# Patient Record
Sex: Male | Born: 1945
Health system: Southern US, Community
[De-identification: ages and names within clinical notes are randomized; demographics above are authoritative.]

## PROBLEM LIST (undated history)

## (undated) HISTORY — PX: APPENDECTOMY: SHX54

## (undated) HISTORY — PX: HAND SURGERY: SHX662

---

## 2003-11-29 ENCOUNTER — Ambulatory Visit: Payer: Self-pay | Admitting: Nurse Practitioner

## 2003-12-11 ENCOUNTER — Ambulatory Visit: Payer: Self-pay | Admitting: Nurse Practitioner

## 2004-01-09 ENCOUNTER — Ambulatory Visit: Payer: Self-pay | Admitting: Nurse Practitioner

## 2004-01-29 ENCOUNTER — Ambulatory Visit: Payer: Self-pay | Admitting: Nurse Practitioner

## 2004-02-13 ENCOUNTER — Ambulatory Visit: Payer: Self-pay | Admitting: Nurse Practitioner

## 2004-04-16 ENCOUNTER — Ambulatory Visit: Payer: Self-pay | Admitting: Nurse Practitioner

## 2004-04-22 ENCOUNTER — Ambulatory Visit: Payer: Self-pay | Admitting: Internal Medicine

## 2004-04-23 ENCOUNTER — Ambulatory Visit (HOSPITAL_COMMUNITY): Admission: RE | Admit: 2004-04-23 | Discharge: 2004-04-23 | Payer: Self-pay | Admitting: Gastroenterology

## 2004-04-23 ENCOUNTER — Encounter (INDEPENDENT_AMBULATORY_CARE_PROVIDER_SITE_OTHER): Payer: Self-pay | Admitting: *Deleted

## 2004-05-26 ENCOUNTER — Ambulatory Visit: Payer: Self-pay | Admitting: Nurse Practitioner

## 2004-08-04 ENCOUNTER — Ambulatory Visit: Payer: Self-pay | Admitting: Nurse Practitioner

## 2004-12-30 ENCOUNTER — Ambulatory Visit: Payer: Self-pay | Admitting: Nurse Practitioner

## 2007-05-23 ENCOUNTER — Ambulatory Visit: Payer: Self-pay | Admitting: Family Medicine

## 2008-03-07 ENCOUNTER — Encounter (INDEPENDENT_AMBULATORY_CARE_PROVIDER_SITE_OTHER): Payer: Self-pay | Admitting: Internal Medicine

## 2008-03-07 ENCOUNTER — Ambulatory Visit: Payer: Self-pay | Admitting: Internal Medicine

## 2008-03-07 LAB — CONVERTED CEMR LAB
AST: 18 units/L (ref 0–37)
Albumin: 4.4 g/dL (ref 3.5–5.2)
Alkaline Phosphatase: 62 units/L (ref 39–117)
Potassium: 4.3 meq/L (ref 3.5–5.3)
Sodium: 141 meq/L (ref 135–145)
Total Protein: 7.6 g/dL (ref 6.0–8.3)

## 2008-04-09 ENCOUNTER — Ambulatory Visit: Payer: Self-pay | Admitting: Internal Medicine

## 2008-04-09 ENCOUNTER — Encounter (INDEPENDENT_AMBULATORY_CARE_PROVIDER_SITE_OTHER): Payer: Self-pay | Admitting: Internal Medicine

## 2008-04-09 LAB — CONVERTED CEMR LAB: PSA: 0.85 ng/mL (ref 0.10–4.00)

## 2008-05-15 ENCOUNTER — Encounter (INDEPENDENT_AMBULATORY_CARE_PROVIDER_SITE_OTHER): Payer: Self-pay | Admitting: Internal Medicine

## 2008-05-15 ENCOUNTER — Ambulatory Visit: Payer: Self-pay | Admitting: Internal Medicine

## 2008-05-15 LAB — CONVERTED CEMR LAB
Cholesterol: 192 mg/dL (ref 0–200)
HDL: 73 mg/dL (ref >39)
LDL Cholesterol: 99 mg/dL (ref 0–99)
Triglycerides: 98 mg/dL (ref <150)
VLDL: 20 mg/dL (ref 0–40)

## 2009-04-19 ENCOUNTER — Ambulatory Visit: Payer: Self-pay | Admitting: Internal Medicine

## 2010-06-06 NOTE — Op Note (Signed)
Garrett Lewis, Garrett Lewis                ACCOUNT NO.:  [id number]   MEDICAL RECORD NO.:  [id number]          PATIENT TYPE:  AMB   LOCATION:  ENDO                         FACILITY:  [facility]   PHYSICIAN:  Petra Kuba, M.D.    DATE OF BIRTH:  [birth year]   DATE OF PROCEDURE:  04/23/2004  DATE OF DISCHARGE:                                 OPERATIVE REPORT   PROCEDURES:  Colonoscopy with polypectomy.   INDICATIONS:  Bright red blood per rectum.  Guaiac positivity.  Due to  colonic screening.  The consent was signed after the risks, benefits,  methods and options were thoroughly discussed in the office.   MEDICINES USED:  1.  Demerol 60 mg.  2.  Versed 6 mg.   DESCRIPTION OF PROCEDURE:  Rectal inspection was pertinent for external  hemorrhoids, small.  Digital exam was negative.  The video pediatric  adjustable colonoscope was inserted and easily advanced around the colon to  the cecum.  Other than some left-sided occasional diverticula, no  abnormalities were seen.  On insertion, it did require some abdominal  pressure, but no position changes.  We did advance for a quick look into the  TI, which was normal on very quick evaluation.  The scope was slowly  withdrawn.  Prep was adequate.  There was some liquid stool that require  washing and suctioning.  On slow withdrawal through the colon, the cecum,  ascending, transverse and descending were normal.  As the scope was  withdrawn around the sigmoid, in the distal sigmoid a moderate sized  pedunculated polyp was seen, snared and electrocautery applied.  The polyp  was removed.  It did fragment into three pieces, two of which were suctioned  onto the head of the scope and removed.  One needed to be snared and  recovered by withdrawing the scope.  After all three pieces were removed and  recovered, the scope was reinserted.  The stalk had a nice white coagulum  without any residual polypoid tissue.  Possibly a few other fragments were  suctioned through the scope and collected in the trap and put in the same  container.  In the rectum was another moderate sized polyp, which was snared  and electrocautery applied.  The polyp was suctioned onto the head of the  scope after removal and this polyp was recovered and put in a second  container.  Possibly there was some residual polypoid and another snare  removed a few pieces of tissues.  A few hot biopsies of the base were  obtained as well and put in the second container.  No obvious residual  polypoidal tissue was seen at the junction.  Anorectal pull through and  retroflexion confirmed some small hemorrhoids.  The scope was straightened  and readvanced a short ways up the left side of the colon.  Air was  suctioned and the scope was removed.  The patient tolerated the procedure  well.  There was no obvious immediate complication.   ENDOSCOPIC DIAGNOSES:  1.  Internal and external hemorrhoids.  2.  Left occasional diverticula.  3.  Medium rectal polyps snared x 2 and hot biopsied at the base x 2.  4.  Moderate size sigmoid pedunculated polyp, status post snaring removed,      although it did break into the three pieces.  All were recovered.  5.  Otherwise within normal limits to the cecum and a quite look at the TI.   PLAN:  Await pathology to determine future screening.  Happy to see back  p.r.n.  Otherwise return care to the [facility] doctors  to repeat guaiacs, CBC, check symptoms and make sure no further workup needs  to be done like an upper GI or small bowel follow through.      MEM/MEDQ  D:  04/23/2004  T:  04/23/2004  Job:  [id number]   cc:   [facility]  [address]

## 2010-06-30 ENCOUNTER — Inpatient Hospital Stay (HOSPITAL_COMMUNITY)
Admission: EM | Admit: 2010-06-30 | Discharge: 2010-07-19 | DRG: 341 | Disposition: A | Discharge status: Home / Self Care | Payer: Medicare Other | Attending: General Surgery | Admitting: General Surgery

## 2010-06-30 ENCOUNTER — Emergency Department (HOSPITAL_COMMUNITY): Payer: Medicare Other

## 2010-06-30 DIAGNOSIS — F1010 Alcohol abuse, uncomplicated: Secondary | ICD-10-CM | POA: Diagnosis present

## 2010-06-30 DIAGNOSIS — A09 Infectious gastroenteritis and colitis, unspecified: Principal | ICD-10-CM | POA: Diagnosis present

## 2010-06-30 DIAGNOSIS — K3533 Acute appendicitis with perforation and localized peritonitis, with abscess: Secondary | ICD-10-CM | POA: Diagnosis not present

## 2010-06-30 DIAGNOSIS — R7309 Other abnormal glucose: Secondary | ICD-10-CM | POA: Diagnosis present

## 2010-06-30 DIAGNOSIS — Z860100 Personal history of colon polyps, unspecified: Secondary | ICD-10-CM

## 2010-06-30 DIAGNOSIS — J690 Pneumonitis due to inhalation of food and vomit: Secondary | ICD-10-CM | POA: Diagnosis not present

## 2010-06-30 DIAGNOSIS — R609 Edema, unspecified: Secondary | ICD-10-CM | POA: Diagnosis not present

## 2010-06-30 DIAGNOSIS — K560 Paralytic ileus: Secondary | ICD-10-CM | POA: Diagnosis not present

## 2010-06-30 DIAGNOSIS — K5900 Constipation, unspecified: Secondary | ICD-10-CM | POA: Diagnosis present

## 2010-06-30 DIAGNOSIS — K5730 Diverticulosis of large intestine without perforation or abscess without bleeding: Secondary | ICD-10-CM | POA: Diagnosis present

## 2010-06-30 DIAGNOSIS — N179 Acute kidney failure, unspecified: Secondary | ICD-10-CM | POA: Diagnosis not present

## 2010-06-30 DIAGNOSIS — R0902 Hypoxemia: Secondary | ICD-10-CM | POA: Diagnosis not present

## 2010-06-30 LAB — URINALYSIS, ROUTINE W REFLEX MICROSCOPIC
Nitrite: NEGATIVE
Specific Gravity, Urine: 1.036 — ABNORMAL HIGH (ref 1.005–1.030)
Urobilinogen, UA: 0.2 mg/dL (ref 0.0–1.0)

## 2010-06-30 LAB — CBC
MCHC: 33.7 g/dL (ref 30.0–36.0)
MCV: 82.3 fL (ref 78.0–100.0)
Platelets: 260 10*3/uL (ref 150–400)
RDW: 13.8 % (ref 11.5–15.5)
WBC: 13.6 10*3/uL — ABNORMAL HIGH (ref 4.0–10.5)

## 2010-06-30 LAB — COMPREHENSIVE METABOLIC PANEL
AST: 15 U/L (ref 0–37)
Albumin: 3.7 g/dL (ref 3.5–5.2)
BUN: 14 mg/dL (ref 6–23)
Calcium: 8.9 mg/dL (ref 8.4–10.5)
Chloride: 101 mEq/L (ref 96–112)
Creatinine, Ser: 1 mg/dL (ref 0.4–1.5)
Total Protein: 7 g/dL (ref 6.0–8.3)

## 2010-06-30 LAB — DIFFERENTIAL
Eosinophils Absolute: 0 10*3/uL (ref 0.0–0.7)
Eosinophils Relative: 0 % (ref 0–5)
Lymphs Abs: 1.3 10*3/uL (ref 0.7–4.0)
Monocytes Absolute: 0.5 10*3/uL (ref 0.1–1.0)

## 2010-06-30 LAB — LIPASE, BLOOD: Lipase: 17 U/L (ref 11–59)

## 2010-06-30 MED ORDER — IOHEXOL 300 MG/ML  SOLN
80.0000 mL | Freq: Once | INTRAMUSCULAR | Status: AC | PRN
  Administered 2010-06-30: 80 mL via INTRAVENOUS

## 2010-07-01 LAB — BASIC METABOLIC PANEL
CO2: 25 mEq/L (ref 19–32)
Chloride: 102 mEq/L (ref 96–112)
Sodium: 137 mEq/L (ref 135–145)

## 2010-07-01 LAB — CBC
Platelets: 241 10*3/uL (ref 150–400)
RBC: 5.23 MIL/uL (ref 4.22–5.81)
WBC: 14.2 10*3/uL — ABNORMAL HIGH (ref 4.0–10.5)

## 2010-07-01 LAB — URINE CULTURE: Culture  Setup Time: 201206111603

## 2010-07-01 NOTE — H&P (Signed)
NAMEKIMOTHY, Garrett Lewis NO.:  [id number]  MEDICAL RECORD NO.:  [id number]  LOCATION:  [facility]                         FACILITY:  [facility]  PHYSICIAN:  Lonia Blood, M.D.DATE OF BIRTH:  1945-11-29  DATE OF ADMISSION:  06/30/2010 DATE OF DISCHARGE:                             HISTORY & PHYSICAL   PRIMARY CARE PHYSICIAN:  [facility].  GI PHYSICIAN:  Petra Kuba, M.D.  CHIEF COMPLAINT:  Abdominal pain.  HISTORY OF PRESENT ILLNESS:  Garrett Lewis is a very pleasant 65 year old gentleman with no significant major medical history in his adult life.  He was in his usual state of health until last night around 9:00 p.m.  At that time, he experienced the acute onset of periumbilical severe cramping abdominal pain.  It was associated with some nausea and a few episodes of vomiting with attempts to hydrate himself.  There was no hematemesis, hematochezia, or melena.  There has been no diarrhea. The patient's pain persisted throughout the night and kept him awake, and that appear to be somewhat worse this morning.  He presented to the hospital for evaluation.  In the emergency room, he has had a CT scan of the abdomen, which has revealed thickening of the terminal ileum, suggestive of the ileitis.  The CT of the abdomen is otherwise unrevealing.  He denies chest pain, fevers, chills, nausea, or vomiting at the present time.  He has had no headache or acute visual change.  He has not been on any trips out of the country.  He drinks city water.  He has not had any known sick contacts as of late.  He has not recently been on any antibiotics.  REVIEW OF SYSTEMS:  A comprehensive review of systems is unremarkable with exception of multiple positive elements noted in the history of present illness above.  PAST MEDICAL HISTORY: 1. History of internal and external hemorrhoids noted via colonoscopy     in 2006 per Dr. Vida Rigger. 2. Known diverticulosis via colonoscopy  in 2006 per Dr. Vida Rigger. 3. Probable alcohol overuse with the patient suggesting that he drinks     3-4 12 ounce of beers per day. 4. 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  OUTPATIENT MEDICATIONS:  None.  ALLERGIES:  No known drug allergies.  FAMILY HISTORY:  Noncontributory, but reviewed with the patient.  SOCIAL HISTORY:  The patient is retired and on Tree surgeon.  He does not smoke or use illicit drugs at this time, though he did smoke previously.  He lives in the [place] area.  He lives by himself.  DATA REVIEW:  White count is elevated at 13.6.  Platelet count and hemoglobin are normal.  MCV is normal.  Lipase is normal.  Sodium, potassium, chloride, bicarb, BUN, and creatinine are normal.  Serum glucose is elevated at 145.  Calcium is normal.  LFTs are all normal. Albumin is 3.7.  CT scan of the abdomen and pelvis as noted above reveals thickening of the terminal ileum suggestive of infection versus inflammatory bowel disease.  PHYSICAL EXAMINATION:  VITAL SIGNS:  Temperature 98.6, blood pressure 148/973, heart rate 60, respiratory rate 20, O2 sats 99% on room air. GENERAL:  Well-developed, well-nourished gentleman in no acute respiratory distress. LUNGS:  Clear to auscultation bilaterally without wheezes or rhonchi. CARDIOVASCULAR:  Regular rate and rhythm without murmur, gallop, or rub. Normal S1, S2. ABDOMEN:  Mildly protuberant, soft.  Bowel sounds are hypoactive but present.  There is no focal tenderness, but there is mild diffuse tenderness with no appreciable focal mass or rebound and no appreciable ascites. EXTREMITIES:  Malformations of the left hand and left foot are noted, which the patient states are chronic, otherwise there is no significant cyanosis, clubbing, or edema of bilateral lower extremities. NEUROLOGIC:  Nonfocal neurologic exam.  IMPRESSION AND PLAN: 1.  Terminal ileitis -- the exact etiology of this patient's ileitis is     not clear.  I suspect that this may be a simple infectious colitis     versus ileitis.  We are treating the patient empirically with Cipro     and Flagyl.  Given his lack of profuse watery diarrhea, attempts to     obtain stool cultures would likely be very low yield.  He has not     had recent antibiotics and therefore suspicion of C diff is also     very low and that he is otherwise healthy and lives in the     community.  I suspect this patient's symptoms will improve nicely     and the empiric antibiotics will likely be all is required.  Given     however, his inability to consistently tolerate p.o. intake at home     and given his elevated white count, I do feel it is prudent to     admit him under observation status to assure that he does appear     well.  If the patient is able to tolerate an advancement of his     diet during his hospital stay and if his white count is improving     with an improved physical exam, we will fully anticipate his     discharge tomorrow morning after repeat evaluation. 2. Hyperglycemia -- the patient states that he does not have a known     history of diabetes.  I will check a hemoglobin A1c in the morning.     It is quite possible this is simply a stress reaction related to     the patient's acute infectious state.  We will not follow CBGs at     this time. 3. Alcohol overuse -- the patient does not have signs or symptoms     concerning for actual alcoholism.  He states that he simply enjoys     a drink with each meal, totaling approximately 3 drinks a day.     Occasionally, he states he has "an extra drink" but it does not     sound as if he ever drinks more than 3-4 drinks per day at maximum.     I have advised the patient that this amount of alcohol intake is     sufficient enough that it could expose him to significant health     risk.  I have advised him that averaging one  alcoholic drink per     day, defined by 12 ounces of beer, 6-8 ounces of wine, or 1 ounce     of liquor is the maximum amount that is felt to be reasonably     tolerated.  He  has agreed that he will significantly cut back if     not completely stop his alcohol use.     Lonia Blood, M.D.     JTM/MEDQ  D:  06/30/2010  T:  06/30/2010  Job:  [id number]  cc:   [facility] Petra Kuba, M.D.  Electronically Signed by Jetty Duhamel M.D. on 06/30/2010 09:53:56 PM

## 2010-07-02 ENCOUNTER — Observation Stay (HOSPITAL_COMMUNITY): Payer: Medicare Other

## 2010-07-02 LAB — CBC
HCT: 41 % (ref 39.0–52.0)
Hemoglobin: 13.6 g/dL (ref 13.0–17.0)
MCH: 27.8 pg (ref 26.0–34.0)
MCHC: 33.2 g/dL (ref 30.0–36.0)
MCV: 83.7 fL (ref 78.0–100.0)

## 2010-07-02 LAB — BASIC METABOLIC PANEL
BUN: 9 mg/dL (ref 6–23)
Calcium: 8.5 mg/dL (ref 8.4–10.5)
Creatinine, Ser: 1.11 mg/dL (ref 0.4–1.5)
GFR calc non Af Amer: >60 mL/min (ref >60)
Glucose, Bld: 131 mg/dL — ABNORMAL HIGH (ref 70–99)
Sodium: 139 mEq/L (ref 135–145)

## 2010-07-02 LAB — DIFFERENTIAL
Basophils Relative: 0 % (ref 0–1)
Lymphs Abs: 0.8 10*3/uL (ref 0.7–4.0)
Monocytes Absolute: 0.6 10*3/uL (ref 0.1–1.0)
Monocytes Relative: 4 % (ref 3–12)
Neutro Abs: 13.5 10*3/uL — ABNORMAL HIGH (ref 1.7–7.7)

## 2010-07-03 ENCOUNTER — Observation Stay (HOSPITAL_COMMUNITY): Payer: Medicare Other

## 2010-07-03 LAB — URINALYSIS, ROUTINE W REFLEX MICROSCOPIC
Glucose, UA: NEGATIVE mg/dL
Leukocytes, UA: NEGATIVE
Protein, ur: NEGATIVE mg/dL
Specific Gravity, Urine: 1.013 (ref 1.005–1.030)
Urobilinogen, UA: 0.2 mg/dL (ref 0.0–1.0)

## 2010-07-03 LAB — COMPREHENSIVE METABOLIC PANEL
ALT: 12 U/L (ref 0–53)
AST: 18 U/L (ref 0–37)
Calcium: 8.3 mg/dL — ABNORMAL LOW (ref 8.4–10.5)
Creatinine, Ser: 0.98 mg/dL (ref 0.4–1.5)
Sodium: 135 mEq/L (ref 135–145)
Total Protein: 7 g/dL (ref 6.0–8.3)

## 2010-07-03 LAB — CBC
MCH: 28 pg (ref 26.0–34.0)
MCHC: 33.5 g/dL (ref 30.0–36.0)
MCV: 83.7 fL (ref 78.0–100.0)
Platelets: 284 10*3/uL (ref 150–400)
RDW: 14.5 % (ref 11.5–15.5)

## 2010-07-03 LAB — URINE MICROSCOPIC-ADD ON

## 2010-07-04 LAB — CBC
MCH: 27.8 pg (ref 26.0–34.0)
MCV: 83.4 fL (ref 78.0–100.0)
Platelets: 290 10*3/uL (ref 150–400)
RBC: 4.71 MIL/uL (ref 4.22–5.81)
RDW: 14.4 % (ref 11.5–15.5)

## 2010-07-04 LAB — COMPREHENSIVE METABOLIC PANEL
ALT: 10 U/L (ref 0–53)
AST: 17 U/L (ref 0–37)
Albumin: 2.5 g/dL — ABNORMAL LOW (ref 3.5–5.2)
CO2: 27 mEq/L (ref 19–32)
Calcium: 8.7 mg/dL (ref 8.4–10.5)
Creatinine, Ser: 0.76 mg/dL (ref 0.50–1.35)
GFR calc non Af Amer: >60 mL/min (ref >60)
Sodium: 138 mEq/L (ref 135–145)
Total Protein: 6.5 g/dL (ref 6.0–8.3)

## 2010-07-04 LAB — URINE CULTURE
Colony Count: NO GROWTH
Culture  Setup Time: 201206142055
Culture: NO GROWTH

## 2010-07-05 LAB — CBC
HCT: 39.3 % (ref 39.0–52.0)
MCHC: 33.8 g/dL (ref 30.0–36.0)
Platelets: 313 10*3/uL (ref 150–400)
RDW: 14.2 % (ref 11.5–15.5)
WBC: 18.5 10*3/uL — ABNORMAL HIGH (ref 4.0–10.5)

## 2010-07-06 ENCOUNTER — Inpatient Hospital Stay (HOSPITAL_COMMUNITY): Payer: Medicare Other

## 2010-07-06 LAB — COMPREHENSIVE METABOLIC PANEL
CO2: 29 mEq/L (ref 19–32)
Calcium: 8.9 mg/dL (ref 8.4–10.5)
Creatinine, Ser: 0.92 mg/dL (ref 0.50–1.35)
GFR calc Af Amer: >60 mL/min (ref >60)
GFR calc non Af Amer: >60 mL/min (ref >60)
Glucose, Bld: 151 mg/dL — ABNORMAL HIGH (ref 70–99)
Sodium: 136 mEq/L (ref 135–145)
Total Protein: 6.4 g/dL (ref 6.0–8.3)

## 2010-07-06 LAB — CULTURE, BLOOD (ROUTINE X 2)
Culture: NO GROWTH
Culture: NO GROWTH

## 2010-07-06 LAB — CBC
HCT: 42.9 % (ref 39.0–52.0)
Hemoglobin: 14.4 g/dL (ref 13.0–17.0)
MCH: 27.5 pg (ref 26.0–34.0)
MCHC: 33.6 g/dL (ref 30.0–36.0)
RDW: 14.3 % (ref 11.5–15.5)

## 2010-07-06 LAB — LACTIC ACID, PLASMA: Lactic Acid, Venous: 1.4 mmol/L (ref 0.5–2.2)

## 2010-07-06 LAB — LIPASE, BLOOD: Lipase: 83 U/L — ABNORMAL HIGH (ref 11–59)

## 2010-07-07 ENCOUNTER — Inpatient Hospital Stay (HOSPITAL_COMMUNITY): Payer: Medicare Other

## 2010-07-07 LAB — CBC
HCT: 37.7 % — ABNORMAL LOW (ref 39.0–52.0)
MCHC: 34.5 g/dL (ref 30.0–36.0)
Platelets: 420 10*3/uL — ABNORMAL HIGH (ref 150–400)
RDW: 14.2 % (ref 11.5–15.5)
WBC: 19.4 10*3/uL — ABNORMAL HIGH (ref 4.0–10.5)

## 2010-07-07 LAB — PRO B NATRIURETIC PEPTIDE: Pro B Natriuretic peptide (BNP): 104.2 pg/mL (ref 0–125)

## 2010-07-07 LAB — DIFFERENTIAL
Basophils Absolute: 0.2 10*3/uL — ABNORMAL HIGH (ref 0.0–0.1)
Eosinophils Absolute: 0 10*3/uL (ref 0.0–0.7)
Lymphocytes Relative: 7 % — ABNORMAL LOW (ref 12–46)
Monocytes Relative: 8 % (ref 3–12)
Neutrophils Relative %: 84 % — ABNORMAL HIGH (ref 43–77)

## 2010-07-07 LAB — COMPREHENSIVE METABOLIC PANEL
ALT: 11 U/L (ref 0–53)
Albumin: 2.3 g/dL — ABNORMAL LOW (ref 3.5–5.2)
Calcium: 8.3 mg/dL — ABNORMAL LOW (ref 8.4–10.5)
GFR calc Af Amer: >60 mL/min (ref >60)
Glucose, Bld: 133 mg/dL — ABNORMAL HIGH (ref 70–99)
Sodium: 137 mEq/L (ref 135–145)
Total Protein: 6.2 g/dL (ref 6.0–8.3)

## 2010-07-08 ENCOUNTER — Other Ambulatory Visit (INDEPENDENT_AMBULATORY_CARE_PROVIDER_SITE_OTHER): Payer: Self-pay | Admitting: Surgery

## 2010-07-08 LAB — BASIC METABOLIC PANEL
CO2: 27 mEq/L (ref 19–32)
Chloride: 100 mEq/L (ref 96–112)
Creatinine, Ser: 0.91 mg/dL (ref 0.50–1.35)
Sodium: 137 mEq/L (ref 135–145)

## 2010-07-08 LAB — CBC
Hemoglobin: 12.3 g/dL — ABNORMAL LOW (ref 13.0–17.0)
MCV: 81.7 fL (ref 78.0–100.0)
Platelets: 488 10*3/uL — ABNORMAL HIGH (ref 150–400)
RBC: 4.47 MIL/uL (ref 4.22–5.81)
WBC: 21 10*3/uL — ABNORMAL HIGH (ref 4.0–10.5)

## 2010-07-08 LAB — SEDIMENTATION RATE: Sed Rate: 48 mm/hr — ABNORMAL HIGH (ref 0–16)

## 2010-07-08 LAB — MRSA PCR SCREENING: MRSA by PCR: NEGATIVE

## 2010-07-08 MED ORDER — IOHEXOL 300 MG/ML  SOLN
100.0000 mL | Freq: Once | INTRAMUSCULAR | Status: AC | PRN
  Administered 2010-07-08: 100 mL via INTRAVENOUS

## 2010-07-09 LAB — BASIC METABOLIC PANEL
CO2: 27 mEq/L (ref 19–32)
Chloride: 102 mEq/L (ref 96–112)
Glucose, Bld: 201 mg/dL — ABNORMAL HIGH (ref 70–99)
Potassium: 4.3 mEq/L (ref 3.5–5.1)
Sodium: 137 mEq/L (ref 135–145)

## 2010-07-09 LAB — CBC
Hemoglobin: 13.5 g/dL (ref 13.0–17.0)
RBC: 4.89 MIL/uL (ref 4.22–5.81)

## 2010-07-10 LAB — CULTURE, BLOOD (ROUTINE X 2)
Culture  Setup Time: 201206150413
Culture: NO GROWTH

## 2010-07-10 LAB — CBC
Hemoglobin: 10.7 g/dL — ABNORMAL LOW (ref 13.0–17.0)
MCH: 27.3 pg (ref 26.0–34.0)
MCHC: 32.9 g/dL (ref 30.0–36.0)
Platelets: 519 10*3/uL — ABNORMAL HIGH (ref 150–400)
RBC: 3.92 MIL/uL — ABNORMAL LOW (ref 4.22–5.81)

## 2010-07-10 LAB — BASIC METABOLIC PANEL
Calcium: 7.5 mg/dL — ABNORMAL LOW (ref 8.4–10.5)
GFR calc non Af Amer: >60 mL/min (ref >60)
Glucose, Bld: 118 mg/dL — ABNORMAL HIGH (ref 70–99)
Potassium: 3.7 mEq/L (ref 3.5–5.1)
Sodium: 139 mEq/L (ref 135–145)

## 2010-07-10 NOTE — Op Note (Signed)
Garrett Lewis, Garrett Lewis NO.:  [id number]  MEDICAL RECORD NO.:  [id number]  LOCATION:  3305                         FACILITY:  [facility]  PHYSICIAN:  Velora Heckler, MD      DATE OF BIRTH:  Apr 28, 1945  DATE OF PROCEDURE:  07/08/2010                               OPERATIVE REPORT   PREOPERATIVE DIAGNOSIS:  Perforated appendicitis with intra-abdominal abscess and diffuse peritonitis.  POSTOPERATIVE DIAGNOSES:  Perforated appendicitis with intra-abdominal abscess and diffuse peritonitis.  PROCEDURES: 1. Exploratory laparotomy. 2. Open appendectomy. 3. Drainage of intra-abdominal abscesses.  SURGEON:  Velora Heckler, MD, FACS  ASSISTANT:  Letha Cape, PA-C  ANESTHESIA:  General per Dr. Sheldon Silvan.  ESTIMATED BLOOD LOSS:  Minimal.  PREPARATION:  ChloraPrep.  COMPLICATIONS:  None.  INDICATIONS:  The patient is a 65 year old black male who presented approximately 9 days ago with abdominal pain.  Initial workup by the Medical Service revealed evidence of terminal ileitis with bowel obstruction.  The patient was admitted on the Medical Service and started on intravenous antibiotics.  After failure to progress, General Surgery was consulted as was Gastroenterology.  A repeat CT scan on the evening of July 07, 2010, demonstrated evidence of perforated appendicitis with generalized peritonitis and intra-abdominal abscesses as well as pneumonia.  The patient was then prepared urgently and brought to the operating room for laparotomy.  BODY OF REPORT:  Procedure was done in OR #17 at the [facility].  The patient was brought to the operating room, placed in a supine position on the operating room table.  Following administration of general anesthesia, the patient was prepped and draped in usual strict aseptic fashion.  After ascertaining that an adequate level of anesthesia had been achieved, the midline abdominal incision was made with a #10  blade.  Dissection was carried through subcutaneous tissues.  Fascia was incised in the midline and the peritoneal cavity was entered cautiously.  There was cloudy purulent fluid present within the peritoneal cavity, which was evacuated.  There were numerous adhesions between loops of small bowel, which were gently broken down with manual dissection.  Interloop abscesses were drained.  In the right lower quadrant is a large abscess associated with the cecum and multiple loops of small bowel.  With great care, the small bowel loops were dissected away from this process.  A grossly perforated appendix was identified.  There was spillage of fecal material into the peritoneal cavity through the appendiceal remnant.  Appendix was resected. Mesenteric vessels were ligated with 2-0 silk ties.  The appendiceal remnant was sutured close with a figure-of-eight suture.  The cecal wall was then dissected out circumferentially and the base of the appendix was closed with a TA-30 stapler and the residual appendix was excised and placed with the specimen and submitted to Pathology.  Peritoneal cavity was irrigated copiously with warm saline.  Small bowel contents were propelled retrograde into the stomach and evacuated with the nasogastric tube.  Small bowel loops were irrigated copiously and all interloop abscesses were broken down.  Pelvic abscess was drained and evacuated and irrigated.  All fluid was evacuated from the peritoneal cavity.  Small bowel was returned to its normal anatomic position.  A 19- [place] Blake drain was brought in from right lower quadrant incision and placed around the base of the cecum and into the previous abscess cavity at the pelvic brim.  The drain was secured to the skin with 3-0 nylon suture.  It was placed to bulb suction.  Omentum was used to cover the small bowel.  Midline incision was then closed with interrupted simple #1 Novafil sutures.  Subcutaneous tissues were  irrigated.  Skin at the umbilicus was reapproximated with stainless steel staples.  Remainder of the incisions and subcutaneous tissue was packed with Betadine-soaked 4x4 gauze sponges.  Dry gauze dressings were applied.  The patient was awakened from anesthesia and brought to the recovery room.  The patient tolerated the procedure well.   Velora Heckler, MD, FACS     TMG/MEDQ  D:  07/08/2010  T:  07/09/2010  Job:  [id number]  Electronically Signed by Darnell Level MD on 07/10/2010 03:33:15 PM

## 2010-07-11 LAB — CBC
Hemoglobin: 10.1 g/dL — ABNORMAL LOW (ref 13.0–17.0)
Platelets: 525 10*3/uL — ABNORMAL HIGH (ref 150–400)
RBC: 3.66 MIL/uL — ABNORMAL LOW (ref 4.22–5.81)
WBC: 15 10*3/uL — ABNORMAL HIGH (ref 4.0–10.5)

## 2010-07-11 LAB — BODY FLUID CULTURE

## 2010-07-11 LAB — BASIC METABOLIC PANEL
BUN: 8 mg/dL (ref 6–23)
Calcium: 7.5 mg/dL — ABNORMAL LOW (ref 8.4–10.5)
Creatinine, Ser: 0.96 mg/dL (ref 0.50–1.35)
GFR calc Af Amer: >60 mL/min (ref >60)
GFR calc non Af Amer: >60 mL/min (ref >60)

## 2010-07-11 LAB — CULTURE, ROUTINE-ABSCESS

## 2010-07-12 LAB — CBC
MCHC: 32.8 g/dL (ref 30.0–36.0)
RDW: 13.9 % (ref 11.5–15.5)

## 2010-07-12 LAB — BASIC METABOLIC PANEL
Calcium: 7.9 mg/dL — ABNORMAL LOW (ref 8.4–10.5)
GFR calc Af Amer: >60 mL/min (ref >60)
GFR calc non Af Amer: >60 mL/min (ref >60)
Potassium: 4 mEq/L (ref 3.5–5.1)
Sodium: 136 mEq/L (ref 135–145)

## 2010-07-13 LAB — ANAEROBIC CULTURE

## 2010-07-13 LAB — CBC
Hemoglobin: 10.7 g/dL — ABNORMAL LOW (ref 13.0–17.0)
MCHC: 33.1 g/dL (ref 30.0–36.0)
Platelets: 617 10*3/uL — ABNORMAL HIGH (ref 150–400)
RBC: 3.94 MIL/uL — ABNORMAL LOW (ref 4.22–5.81)

## 2010-07-13 LAB — VANCOMYCIN, TROUGH: Vancomycin Tr: 16 ug/mL (ref 10.0–20.0)

## 2010-07-14 ENCOUNTER — Inpatient Hospital Stay (HOSPITAL_COMMUNITY): Payer: Medicare Other

## 2010-07-14 LAB — CULTURE, BLOOD (ROUTINE X 2)

## 2010-07-14 LAB — CBC
HCT: 31.7 % — ABNORMAL LOW (ref 39.0–52.0)
RDW: 13.9 % (ref 11.5–15.5)
WBC: 12.8 10*3/uL — ABNORMAL HIGH (ref 4.0–10.5)

## 2010-07-14 LAB — BASIC METABOLIC PANEL
BUN: 7 mg/dL (ref 6–23)
Chloride: 100 mEq/L (ref 96–112)
GFR calc Af Amer: >60 mL/min (ref >60)
GFR calc non Af Amer: 55 mL/min — ABNORMAL LOW (ref >60)
Potassium: 3.8 mEq/L (ref 3.5–5.1)
Sodium: 137 mEq/L (ref 135–145)

## 2010-07-14 NOTE — Consult Note (Signed)
  NAMEOLIS, Garrett NO.:  [id number]  MEDICAL RECORD NO.:  [id number]  LOCATION:  4508                         FACILITY:  [facility]  PHYSICIAN:  Gabrielle Dare. Janee Morn, M.D.DATE OF BIRTH:  May 21, 1945  DATE OF CONSULTATION:  07/06/2010 DATE OF DISCHARGE:                                CONSULTATION   CHIEF COMPLAINT:  Abdominal pain.  HISTORY OF PRESENT ILLNESS:  Mr. Zehner is a 65 year old African American gentleman who was admitted on June 30, 2010, with crampy abdominal pain. CT scan at that time demonstrated terminal ileitis.  He was admitted to the Hospitalist Service and treated with Cipro and Flagyl.  The patient has had ongoing obstructive symptoms.  NG tube is currently in place. Followup abdominal x-rays today showed mildly dilated small bowel loops consistent with at least partial small bowel obstruction.  White blood cell count has been ranging between 18,000 and 22,000.  We are asked to see him from a surgery standpoint.  PAST MEDICAL HISTORY: 1. 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 2. Diverticulosis.  PAST SURGICAL HISTORY:  Reconstruction of the extremities.  SOCIAL HISTORY:  He does not smoke.  He drinks 3-4 beers per day.  He is on disability.  [employer] on the computer.  ALLERGIES:  No known drug allergies.  REVIEW OF SYSTEMS:  GI complaints as above with the addition of the patient had a bowel movement tonight after an enema, otherwise system review was unremarkable.  PHYSICAL EXAMINATION:  VITAL SIGNS:  Temperature 97.8, blood pressure 130/92, heart rate 110, respirations 20, and saturations 96% on room air. GENERAL:  The patient is awake and alert. HEENT:  NG tube is in place with bilious output. NECK:  Supple. LUNGS:  Clear to auscultation without wheezing. HEART:  Regular with no murmurs.  Impulses palpable in the left chest. ABDOMEN:  Distended.  There is mild tenderness to palpation  diffusely. Bowel sounds are present.  There is no peritoneal signs.  No masses are felt. EXTREMITIES:  Bilateral lower extremity edema. SKIN:  Dry.  Data reviewed includes labs and x-rays as above.  IMPRESSION AND PLAN:  Inflamed terminal ileum leading to partial small bowel obstruction.  I agree with nasogastric tube and Cipro and Flagyl right now.  I would recommend GI evaluation for possible inflammatory bowel disease.  No emergent surgery is required at this time.  We will follow the patient along with you.  Plan was discussed in detail with the patient.     Gabrielle Dare Janee Morn, M.D.     BET/MEDQ  D:  07/06/2010  T:  07/07/2010  Job:  [id number]  cc:   [facility]  Electronically Signed by Violeta Gelinas M.D. on 07/14/2010 12:29:50 PM

## 2010-07-15 ENCOUNTER — Other Ambulatory Visit (INDEPENDENT_AMBULATORY_CARE_PROVIDER_SITE_OTHER): Payer: Self-pay | Admitting: General Surgery

## 2010-07-15 DIAGNOSIS — J690 Pneumonitis due to inhalation of food and vomit: Secondary | ICD-10-CM

## 2010-07-15 DIAGNOSIS — K651 Peritoneal abscess: Secondary | ICD-10-CM

## 2010-07-15 LAB — BASIC METABOLIC PANEL
CO2: 27 mEq/L (ref 19–32)
Chloride: 105 mEq/L (ref 96–112)
Creatinine, Ser: 1.99 mg/dL — ABNORMAL HIGH (ref 0.50–1.35)
GFR calc Af Amer: 41 mL/min — ABNORMAL LOW (ref >60)
Potassium: 3.5 mEq/L (ref 3.5–5.1)
Sodium: 142 mEq/L (ref 135–145)

## 2010-07-15 LAB — PRO B NATRIURETIC PEPTIDE: Pro B Natriuretic peptide (BNP): 428.4 pg/mL — ABNORMAL HIGH (ref 0–125)

## 2010-07-15 LAB — URINALYSIS, ROUTINE W REFLEX MICROSCOPIC
Glucose, UA: NEGATIVE mg/dL
Leukocytes, UA: NEGATIVE
Nitrite: NEGATIVE
Specific Gravity, Urine: 1.011 (ref 1.005–1.030)
pH: 6.5 (ref 5.0–8.0)

## 2010-07-15 LAB — URINE MICROSCOPIC-ADD ON

## 2010-07-15 LAB — COMPREHENSIVE METABOLIC PANEL
Albumin: 2.3 g/dL — ABNORMAL LOW (ref 3.5–5.2)
Alkaline Phosphatase: 63 U/L (ref 39–117)
BUN: 8 mg/dL (ref 6–23)
Creatinine, Ser: 1.98 mg/dL — ABNORMAL HIGH (ref 0.50–1.35)
GFR calc Af Amer: 42 mL/min — ABNORMAL LOW (ref >60)
Glucose, Bld: 125 mg/dL — ABNORMAL HIGH (ref 70–99)
Potassium: 3.7 mEq/L (ref 3.5–5.1)
Total Protein: 7 g/dL (ref 6.0–8.3)

## 2010-07-15 LAB — SODIUM, URINE, RANDOM: Sodium, Ur: 58 mEq/L

## 2010-07-15 LAB — NA AND K (SODIUM & POTASSIUM), RAND UR: Sodium, Ur: 58 mEq/L

## 2010-07-15 LAB — OSMOLALITY, URINE: Osmolality, Ur: 227 mOsm/kg — ABNORMAL LOW (ref 390–1090)

## 2010-07-16 DIAGNOSIS — M7989 Other specified soft tissue disorders: Secondary | ICD-10-CM

## 2010-07-16 LAB — CBC
HCT: 31.6 % — ABNORMAL LOW (ref 39.0–52.0)
Hemoglobin: 10.2 g/dL — ABNORMAL LOW (ref 13.0–17.0)
MCHC: 32.3 g/dL (ref 30.0–36.0)
RBC: 3.78 MIL/uL — ABNORMAL LOW (ref 4.22–5.81)

## 2010-07-16 LAB — URINE CULTURE: Culture  Setup Time: 201206261156

## 2010-07-16 LAB — COMPREHENSIVE METABOLIC PANEL
ALT: 17 U/L (ref 0–53)
Alkaline Phosphatase: 57 U/L (ref 39–117)
BUN: 7 mg/dL (ref 6–23)
CO2: 27 mEq/L (ref 19–32)
GFR calc Af Amer: 39 mL/min — ABNORMAL LOW (ref >60)
GFR calc non Af Amer: 33 mL/min — ABNORMAL LOW (ref >60)
Glucose, Bld: 104 mg/dL — ABNORMAL HIGH (ref 70–99)
Potassium: 3.8 mEq/L (ref 3.5–5.1)
Sodium: 145 mEq/L (ref 135–145)
Total Bilirubin: 0.2 mg/dL — ABNORMAL LOW (ref 0.3–1.2)
Total Protein: 6.6 g/dL (ref 6.0–8.3)

## 2010-07-16 LAB — PREALBUMIN: Prealbumin: 12.8 mg/dL — ABNORMAL LOW (ref 17.0–34.0)

## 2010-07-16 LAB — VANCOMYCIN, RANDOM: Vancomycin Rm: 31 ug/mL

## 2010-07-17 ENCOUNTER — Inpatient Hospital Stay (HOSPITAL_COMMUNITY): Payer: Medicare Other

## 2010-07-17 LAB — BASIC METABOLIC PANEL
BUN: 6 mg/dL (ref 6–23)
CO2: 25 mEq/L (ref 19–32)
Chloride: 109 mEq/L (ref 96–112)
Creatinine, Ser: 2.01 mg/dL — ABNORMAL HIGH (ref 0.50–1.35)
GFR calc Af Amer: 41 mL/min — ABNORMAL LOW (ref >60)
Glucose, Bld: 105 mg/dL — ABNORMAL HIGH (ref 70–99)
Potassium: 3.4 mEq/L — ABNORMAL LOW (ref 3.5–5.1)

## 2010-07-17 LAB — CBC
HCT: 28.6 % — ABNORMAL LOW (ref 39.0–52.0)
Hemoglobin: 9.1 g/dL — ABNORMAL LOW (ref 13.0–17.0)
MCV: 83.6 fL (ref 78.0–100.0)
RBC: 3.42 MIL/uL — ABNORMAL LOW (ref 4.22–5.81)
RDW: 14.1 % (ref 11.5–15.5)
WBC: 10.3 10*3/uL (ref 4.0–10.5)

## 2010-07-18 LAB — BASIC METABOLIC PANEL
BUN: 7 mg/dL (ref 6–23)
Chloride: 111 mEq/L (ref 96–112)
Creatinine, Ser: 1.93 mg/dL — ABNORMAL HIGH (ref 0.50–1.35)
GFR calc Af Amer: 43 mL/min — ABNORMAL LOW (ref >60)
GFR calc non Af Amer: 35 mL/min — ABNORMAL LOW (ref >60)
Glucose, Bld: 101 mg/dL — ABNORMAL HIGH (ref 70–99)
Potassium: 4 mEq/L (ref 3.5–5.1)

## 2010-07-18 LAB — CBC
HCT: 34.4 % — ABNORMAL LOW (ref 39.0–52.0)
Hemoglobin: 10.9 g/dL — ABNORMAL LOW (ref 13.0–17.0)
MCHC: 31.7 g/dL (ref 30.0–36.0)
MCV: 84.1 fL (ref 78.0–100.0)
RDW: 14 % (ref 11.5–15.5)

## 2010-07-19 LAB — BASIC METABOLIC PANEL
BUN: 8 mg/dL (ref 6–23)
CO2: 25 mEq/L (ref 19–32)
Calcium: 8.5 mg/dL (ref 8.4–10.5)
Chloride: 111 mEq/L (ref 96–112)
Creatinine, Ser: 1.9 mg/dL — ABNORMAL HIGH (ref 0.50–1.35)

## 2010-07-24 ENCOUNTER — Telehealth (INDEPENDENT_AMBULATORY_CARE_PROVIDER_SITE_OTHER): Payer: Self-pay | Admitting: General Surgery

## 2010-07-29 ENCOUNTER — Encounter (INDEPENDENT_AMBULATORY_CARE_PROVIDER_SITE_OTHER): Payer: Self-pay | Admitting: Surgery

## 2010-07-29 ENCOUNTER — Ambulatory Visit (INDEPENDENT_AMBULATORY_CARE_PROVIDER_SITE_OTHER): Payer: Medicaid Other | Admitting: Surgery

## 2010-07-29 VITALS — BP 128/88 | HR 72 | Temp 96.9°F

## 2010-07-29 DIAGNOSIS — K3533 Acute appendicitis with perforation and localized peritonitis, with abscess: Secondary | ICD-10-CM

## 2010-07-29 NOTE — Progress Notes (Signed)
HISTORY: Patient returns for followup having undergone exploratory laparotomy for perforated appendicitis with abscess in mid June 2012. He was discharged home at the end of June on IV antibiotics which are completing their course of therapy this week. He has a midline abdominal incision which is being addressed once daily by home health nurses wet-to-dry.   PERTINENT REVIEW OF SYSTEMS: Patient continues to note some swelling in the lower extremities. He denies significant abdominal pain. He is eating a regular diet without difficulty. He reports normal bowel function.   EXAM: Abdomen is still protuberant. Bowel sounds are present. The midline wound is clean and granulating. There is no evidence of hernia. Remaining staples are removed.   IMPRESSION: Status post exploratory laparotomy with appendectomy and drainage of intra-abdominal abscesses.   PLAN: We will contact the home health nursing agency and continue daily dressing changes. We will also contact the pharmacy regarding completion of his course of intravenous antibiotics. Patient will return to see me for a wound check in 3 weeks.

## 2010-08-06 NOTE — Consult Note (Signed)
  Garrett Lewis, KILLGORE NO.:  [id number]  MEDICAL RECORD NO.:  [id number]  LOCATION:  4508                         FACILITY:  [facility]  PHYSICIAN:  Johara Lodwick C. [name], M.D.    DATE OF BIRTH:  1945/03/11  DATE OF CONSULTATION:  07/07/2010 DATE OF DISCHARGE:                                CONSULTATION   REASON FOR CONSULTATION:  Small-bowel obstruction.  HISTORY OF PRESENT ILLNESS:  The patient is a 65 year old black male, who presented a week ago with abdominal pain or periumbilical abdominal pain with nausea and vomiting with CT scan showing thickening of the terminal ileum suggestive of an ileitis.  He was treated conservatively at that time with noting that he has had colonoscopies in the past by Dr. Ewing Schlein, showing only diverticulosis, apparently he does have history of colon polyps.  Initial white blood cell count was 13,600.  He was started on Cipro and Flagyl for presumed infectious etiology.  He has had initial improvement on clear liquid diet followed by worsening of symptoms when diet was advanced.  Yesterday began having nausea and vomiting when diet was advanced and actually requested an NG tube which was placed.  So far he is filled up 2 liter canister with dark brownish fluid.  He also has had some bowel movements last 24 hours only with the assistance of enemas.  He has had a slightly rising white blood cell count, currently running about 20,000.  He has not had any fever in the last several days.  He does have some plate-like atelectasis on chest x- ray and felt that pneumonia could not be ruled out.  He has no family history of inflammatory bowel disease.  General surgery was consulted this morning regarding apparent persistent obstruction and they consulted [place].  PAST MEDICAL HISTORY:  Essentially unremarkable.  PAST SURGICAL HISTORY:  Surgery for congenital malformations of upper and lower extremities.  SOCIAL HISTORY:  Drinks 3-4 beers daily.   Denies cigarette smoking.  ALLERGIES:  None.  PHYSICAL EXAMINATION:  GENERAL:  Alert, oriented, talkative, no acute distress.  NG tube in place. HEART:  Regular rate and rhythm without murmur. ABDOMEN:  Distended, somewhat tight, but the patient states much less tight than yesterday.  LABORATORY DATA:  Lipase slightly elevated at 96, WBC 19,000, hemoglobin 13, and hematocrit 37.7.  IMPRESSION:  Abdominal pain, nausea, vomiting, consistent with small- bowel obstruction with thickened terminal ileum seen on initial CT scan 1 week ago and followup KUBs, continuing to show dilated loops of small bowel.  PLAN:  We will go ahead and proceed with repeat CT with CT enterography given that has been a week since he has been started on antibiotic therapy without apparent clinical improvement.  We will continue NG suction until then, further recommendations to follow.          ______________________________ Everardo All [name], M.D.     JCH/MEDQ  D:  07/07/2010  T:  07/07/2010  Job:  [id number]  Electronically Signed by Dorena Cookey M.D. on 08/06/2010 07:11:14 PM

## 2010-08-18 ENCOUNTER — Ambulatory Visit (INDEPENDENT_AMBULATORY_CARE_PROVIDER_SITE_OTHER): Payer: Medicaid Other | Admitting: Surgery

## 2010-08-18 ENCOUNTER — Encounter (INDEPENDENT_AMBULATORY_CARE_PROVIDER_SITE_OTHER): Payer: Self-pay | Admitting: Surgery

## 2010-08-18 VITALS — Temp 96.7°F

## 2010-08-18 DIAGNOSIS — K3533 Acute appendicitis with perforation and localized peritonitis, with abscess: Secondary | ICD-10-CM

## 2010-08-18 NOTE — Progress Notes (Signed)
HISTORY: Open wound after appendectomy.   PERTINENT REVIEW OF SYSTEMS: Improved appetite, daily bowel movements   EXAM: Clean, granulating wound.   IMPRESSION: Wound healing by secondary intention.   PLAN: Twice daily dressing changes with antibiotic ointment and dry gauze.  Daily shower.

## 2010-08-22 NOTE — Discharge Summary (Signed)
Garrett Lewis, CHAPA NO.:  [id number]  MEDICAL RECORD NO.:  [id number]  LOCATION:  5155                         FACILITY:  [facility]  PHYSICIAN:  Mary Sella. Andrey Campanile, MD     DATE OF BIRTH:  18-May-1945  DATE OF ADMISSION:  06/30/2010 DATE OF DISCHARGE:  07/19/2010                              DISCHARGE SUMMARY   ADMISSION DIAGNOSES: 1. Abdominal pain with terminal ileitis. 2. Hyperglycemia. 3. History of alcohol overuse.  DISCHARGE DIAGNOSES: 1. Perforated appendicitis with intra-abdominal abscess and diffuse     peritonitis. 2. Postoperative ileus open abdominal wound. 3. Acute renal failure probably secondary to Vancomycin Rx. 4. Questionable aspiration pneumonia. 5. History of hyperglycemia. 6. History of alcohol abuse.  PROCEDURES: 1. CT scan July 02, 2010, and July 08, 2010. 2. Exploratory laparotomy, open appendectomy and drainage of intra-     abdominal abscess.  BRIEF HISTORY:  The patient is a 65 year old gentleman who presented 9 days prior to surgery with abdominal pain.  His workup by the Medical Service revealed terminal ileitis with bowel obstruction.  He was admitted to Medical Service and placed on IV antibiotics.  After failure to progress, General Surgery was consulted as was GI Medicine.  A repeat CT scan on July 07, 2010, demonstrated evidence of perforated appendix and generalized peritonitis with an intra-abdominal abscess, which was not found on the original CT. Patient was also found to have a probable pnemonia.  The patient was subsequently seen and taken to the OR for surgery.  PAST MEDICAL HISTORY: 1. He has history of internal and external hemorrhoids by Dr. Ewing Schlein in     2006. 2. History of diverticulosis in 2006. 3. Probable alcohol abuse. 4. 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  ALLERGIES:  None known.  OUTPATIENT MEDICATIONS:   None.  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  He does not smoke, use illicit drugs.  He lives in [place] with a history of some alcohol use.  HOSPITAL COURSE:  As noted above the patient was admitted on June 30, 2010, and initial CT scan was consistent with terminal ileitis, there was no evidence of appendicitis at that time.  He was treated with medical management including Cipro and Flagyl.  His symptoms did not improve.  He was seen on July 06, 2010, by Dr. Janee Morn and underwent a repeat CT scan on July 07, 2010.  This showed a perforated appendicitis with intra-abdominal abscess.  At that point, he was seen and taken to the OR by Dr. Gerrit Friends on July 08, 2010, with the above-noted procedure. He tolerated this well and was transferred to the step-down intensive care unit.  He was there for the next 48 hours making slow progress. His wounds were stable but he had a prolonged ileus.  He was ultimately transferred to the floor, was hemodynamically stable at this point. Cultures grew out staph and strep viridans.  He was maintained on Zosyn and vancomycin.  He then developed some mild renal insufficiency, he went from a normal creatinine of 0.86 up to 1.3 on July 14, 2010.  The patient also had bilateral lower extremity pitting edema.  He appeared to be desaturated although this turned out to be  secondary to the monitor.  The following days, his creatinine went to 1.99.  The vancomycin was discontinued.  Medicine had signed off and we asked him to come back and help with management of his renal insufficiency.  He had been maintained on antibiotics  preoperatively and postoperatively  with Vancomycin and Zosyn.  He made slow and steady progress.  His ileus finally improved and we were able to start him on some liquids.  He was fairly intolerant of being in the hospital and was anxious to go home and be fed. He never rreally understood why we couldn't feed him soner. His NG tube  was finally removed.  He was placed on clear liquids and advanced to a soft diet.  He was seen in consultation by Infectious Disease because of his peritonitis, possible pneumonia, and ruptured appendix.  They recommended a total of 20 days of IV antibiotics.  At this point he was just on Zosyn.  His creatinine was 2.01 on July 17, 2010.  It went down to 1.93 on July 18, 2010, and then on July 19, 2010, it was down to 1.9.  At that point, it was decided to switch him over to [facility] should so that he has once a day treatment and arrange for home health to set him up for his antibiotic and wound care. By July 19, 2010, he was seen by Dr. Jamey Ripa and it was his opinion that the patient will be ready for discharge later in the day.  His bilateral lower extremity edema was felt to be secondary to prolonged malnutrition and protein depletion. Once his diet was advanced he has made good progress.  His edema was improving.  CONSULTS:  Dr. Dorena Cookey July 06, 2010, who agreed with CT scan.  He was also seen on July 06, 2010, by Dr. Janee Morn for Surgery.  They have also recommended a GI evaluation for a small bowel inflammation.  DISCHARGE INSTRUCTIONS:  Home health nurse with  labs on July 21, 2010, with results called to [facility] office.  He is to return to the [facility] in 2 weeks to call the following Monday for followup appointment. He was discharged on no oral meds just the Invanz 1 g daily for a total of 20 days.  CONDITION ON DISCHARGE:  Improved.     Garrett Lewis, P.A.   ______________________________ Mary Sella. Andrey Campanile, MD    WDJ/MEDQ  D:  08/06/2010  T:  08/07/2010  Job:  [id number]  cc:   Petra Kuba, M.D. [facility]  Electronically Signed by Sherrie George P.A. on 08/07/2010 09:24:58 PM Electronically Signed by Gaynelle Adu M.D. on 08/22/2010 01:15:30 PM

## 2010-09-24 ENCOUNTER — Encounter (INDEPENDENT_AMBULATORY_CARE_PROVIDER_SITE_OTHER): Payer: Self-pay | Admitting: Surgery

## 2010-09-24 ENCOUNTER — Ambulatory Visit (INDEPENDENT_AMBULATORY_CARE_PROVIDER_SITE_OTHER): Payer: Self-pay | Admitting: Surgery

## 2010-09-24 VITALS — BP 130/78 | HR 80 | Temp 97.0°F | Ht 65.0 in | Wt 145.8 lb

## 2010-09-24 DIAGNOSIS — K3533 Acute appendicitis with perforation and localized peritonitis, with abscess: Secondary | ICD-10-CM

## 2010-09-24 NOTE — Progress Notes (Signed)
Visit Diagnoses: 1. Appendicitis with peritoneal abscess     HISTORY: Patient returns for wound check. Patient is applying antibiotic ointment 2-3 times daily.   EXAM: Wound is clean and granulating. It is nearly closed measuring less than 1 cm in width and approximately 5 cm in length. A small suture tag is excised. Wound was cauterized with silver nitrate. Antibiotic ointment and dry gauze dressings are applied.   IMPRESSION: History of perforated appendicitis, wound healing by secondary intention   PLAN: The patient will continue application of topical antibiotic ointment until the wound is completely re\re epithelialized. He will then switched to cocoa butter. He may shower once daily. He will return for a final wound check in approximately 4 weeks.   Velora Heckler, MD, FACS General & Endocrine Surgery [facility]

## 2010-09-24 NOTE — Patient Instructions (Signed)
Continue antibiotic ointment until would is closed, then switch to cocoa butter  COCOA BUTTER & VITAMIN E CREAM  ([employer] or other brand)  Apply cocoa butter/vitamin E cream to your incision 2 - 3 times daily.  Massage cream into incision for one minute with each application.  Use sunscreen (50 SPF or higher) for first 6 months after surgery.  You may substitute Mederma or other scar reducing creams as desired.

## 2010-10-29 ENCOUNTER — Ambulatory Visit (INDEPENDENT_AMBULATORY_CARE_PROVIDER_SITE_OTHER): Payer: Medicare Other | Admitting: Surgery

## 2010-10-29 ENCOUNTER — Encounter (INDEPENDENT_AMBULATORY_CARE_PROVIDER_SITE_OTHER): Payer: Self-pay | Admitting: Surgery

## 2010-10-29 VITALS — BP 136/88 | HR 66 | Temp 97.1°F | Resp 14 | Ht 65.0 in | Wt 139.0 lb

## 2010-10-29 DIAGNOSIS — K3533 Acute appendicitis with perforation and localized peritonitis, with abscess: Secondary | ICD-10-CM

## 2010-10-29 NOTE — Progress Notes (Signed)
Visit Diagnoses: 1. Appendicitis with peritoneal abscess     HISTORY: Patient returns for final wound check.   EXAM: Wound is nearly completely epithelialized with a small less than 1 cm area of granulation tissue at the superior most portion of the wound. This is cauterized with silver nitrate. Antibiotic ointment and a Band-Aid are applied as dressing.   IMPRESSION: History of perforated appendicitis, status post laparotomy, wound healing by secondary intention   PLAN: Wound care instructions are given. Patient does not wish another followup appointment at this time. He will contact us if he has any further problems with wound management.   Velora Heckler, MD, FACS General & Endocrine Surgery [facility]

## 2011-01-23 ENCOUNTER — Encounter (INDEPENDENT_AMBULATORY_CARE_PROVIDER_SITE_OTHER): Payer: Medicare Other | Admitting: Surgery

## 2011-01-26 ENCOUNTER — Encounter (INDEPENDENT_AMBULATORY_CARE_PROVIDER_SITE_OTHER): Payer: Medicare Other | Admitting: Surgery

## 2011-01-27 ENCOUNTER — Ambulatory Visit (INDEPENDENT_AMBULATORY_CARE_PROVIDER_SITE_OTHER): Payer: Medicare Other | Admitting: Surgery

## 2011-01-27 ENCOUNTER — Encounter (INDEPENDENT_AMBULATORY_CARE_PROVIDER_SITE_OTHER): Payer: Self-pay | Admitting: Surgery

## 2011-01-27 VITALS — BP 128/76 | HR 78 | Temp 97.2°F | Resp 18 | Ht 65.0 in | Wt 145.2 lb

## 2011-01-27 DIAGNOSIS — K3533 Acute appendicitis with perforation and localized peritonitis, with abscess: Secondary | ICD-10-CM

## 2011-01-27 NOTE — Patient Instructions (Signed)
Antibiotic ointment to wound 3 x daily.  tmg

## 2011-01-27 NOTE — Progress Notes (Signed)
Visit Diagnoses: 1. Appendicitis with peritoneal abscess     HISTORY: Patient presents with 2 small suture granulomas on his midline incision  EXAM: Suture tag is excised. Granulation tissue is debridement. Wound was cauterized with silver nitrate and antibiotic ointment and bandages are applied.  IMPRESSION: Suture granuloma midline incision  PLAN: Wound care instructions were given. Patient will return as needed.  Velora Heckler, MD, FACS General & Endocrine Surgery [facility]

## 2011-03-17 ENCOUNTER — Telehealth (INDEPENDENT_AMBULATORY_CARE_PROVIDER_SITE_OTHER): Payer: Self-pay | Admitting: General Surgery

## 2011-03-17 NOTE — Telephone Encounter (Signed)
No emergency.  See in office when appointment available.  Continue local wound care. tmg

## 2011-03-17 NOTE — Telephone Encounter (Addendum)
The patient called regarding his wound, States he is still having drainage, clear with slight blood tinge, afebrile and no heat to touch. He would like to know if he should be seen.

## 2011-04-29 ENCOUNTER — Ambulatory Visit (INDEPENDENT_AMBULATORY_CARE_PROVIDER_SITE_OTHER): Payer: Medicare Other | Admitting: Surgery

## 2011-04-29 ENCOUNTER — Encounter (INDEPENDENT_AMBULATORY_CARE_PROVIDER_SITE_OTHER): Payer: Self-pay | Admitting: Surgery

## 2011-04-29 VITALS — BP 118/66 | HR 68 | Temp 97.6°F | Resp 18 | Ht 66.0 in | Wt 145.5 lb

## 2011-04-29 DIAGNOSIS — IMO0002 Reserved for concepts with insufficient information to code with codable children: Secondary | ICD-10-CM | POA: Insufficient documentation

## 2011-04-29 NOTE — Progress Notes (Signed)
Visit Diagnoses: 1. Chronic wound of head, neck, or trunk     HISTORY: Patient had undergone exploratory laparotomy for perforated appendicitis. He has had problems with wound healing at the upper portion of his incision. He returns today for evaluation.  EXAM: At the upper portion of the incision there is granulation tissue. This is anesthetized with 0.5% Marcaine with epinephrine. Granulation tissue was then excised. Suture material is identified and extracted. Antibiotic ointment and a Band-Aid placed as dressing. Hemostasis was obtained with silver nitrate.  IMPRESSION: Chronic granulation at site of suture in midline abdominal incision, excised  PLAN: Wound care instructions are given to the patient. He will return as needed.  Velora Heckler, MD, FACS General & Endocrine Surgery [facility]

## 2011-04-29 NOTE — Patient Instructions (Signed)
Antibiotic ointment to wounds 2-3 times daily until healed.  tmg

## 2011-05-13 ENCOUNTER — Telehealth (INDEPENDENT_AMBULATORY_CARE_PROVIDER_SITE_OTHER): Payer: Self-pay | Admitting: Surgery

## 2011-05-13 NOTE — Telephone Encounter (Signed)
Dr. Gerrit Friends aware.

## 2011-09-01 NOTE — Telephone Encounter (Signed)
Phone coverage this day...cleaning out inbasket 

## 2011-11-04 ENCOUNTER — Other Ambulatory Visit: Payer: Self-pay | Admitting: Gastroenterology

## 2012-03-24 IMAGING — CR DG ABDOMEN 2V
2 series · 2 of 2 positions shown · non-contrast
Comparison: 07/14/2010

CLINICAL DATA: Abdominal pain and distention.  Follow-up small
bowel obstruction

ABDOMEN - 2 VIEW

[w abdomen upright]
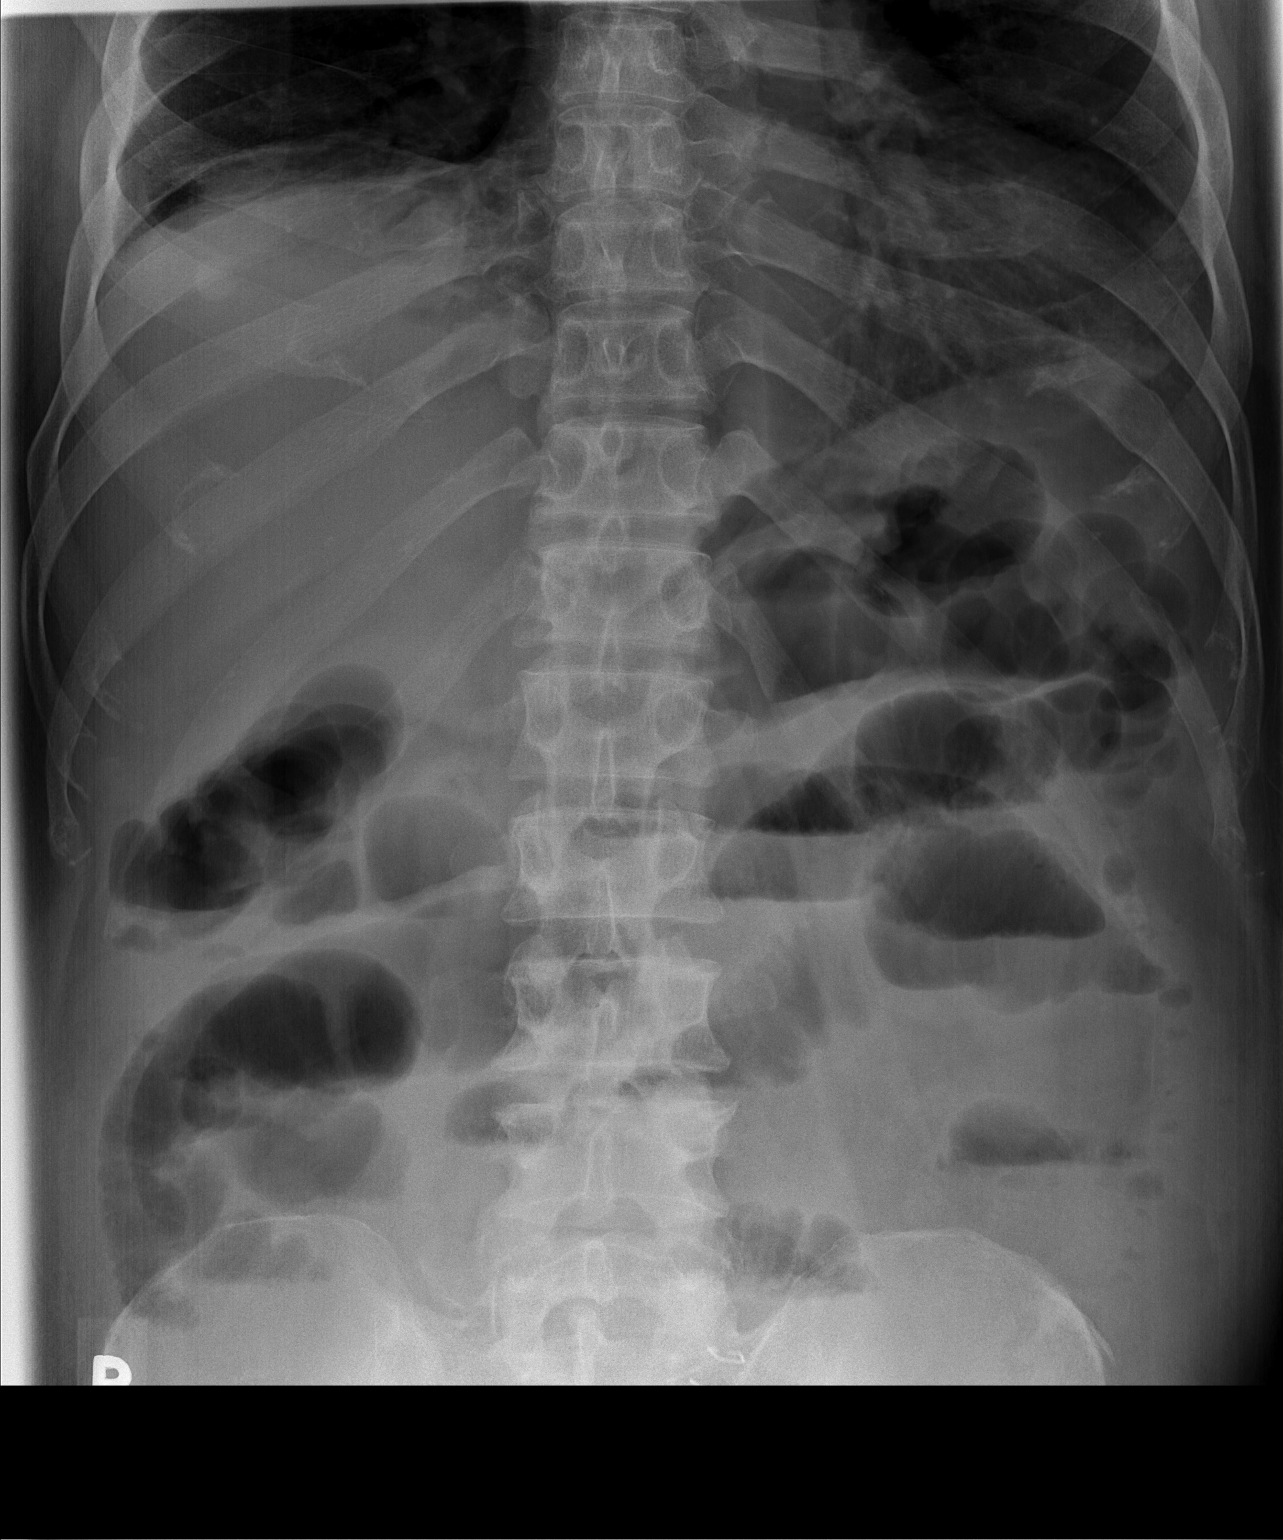

[t abdomen supine]
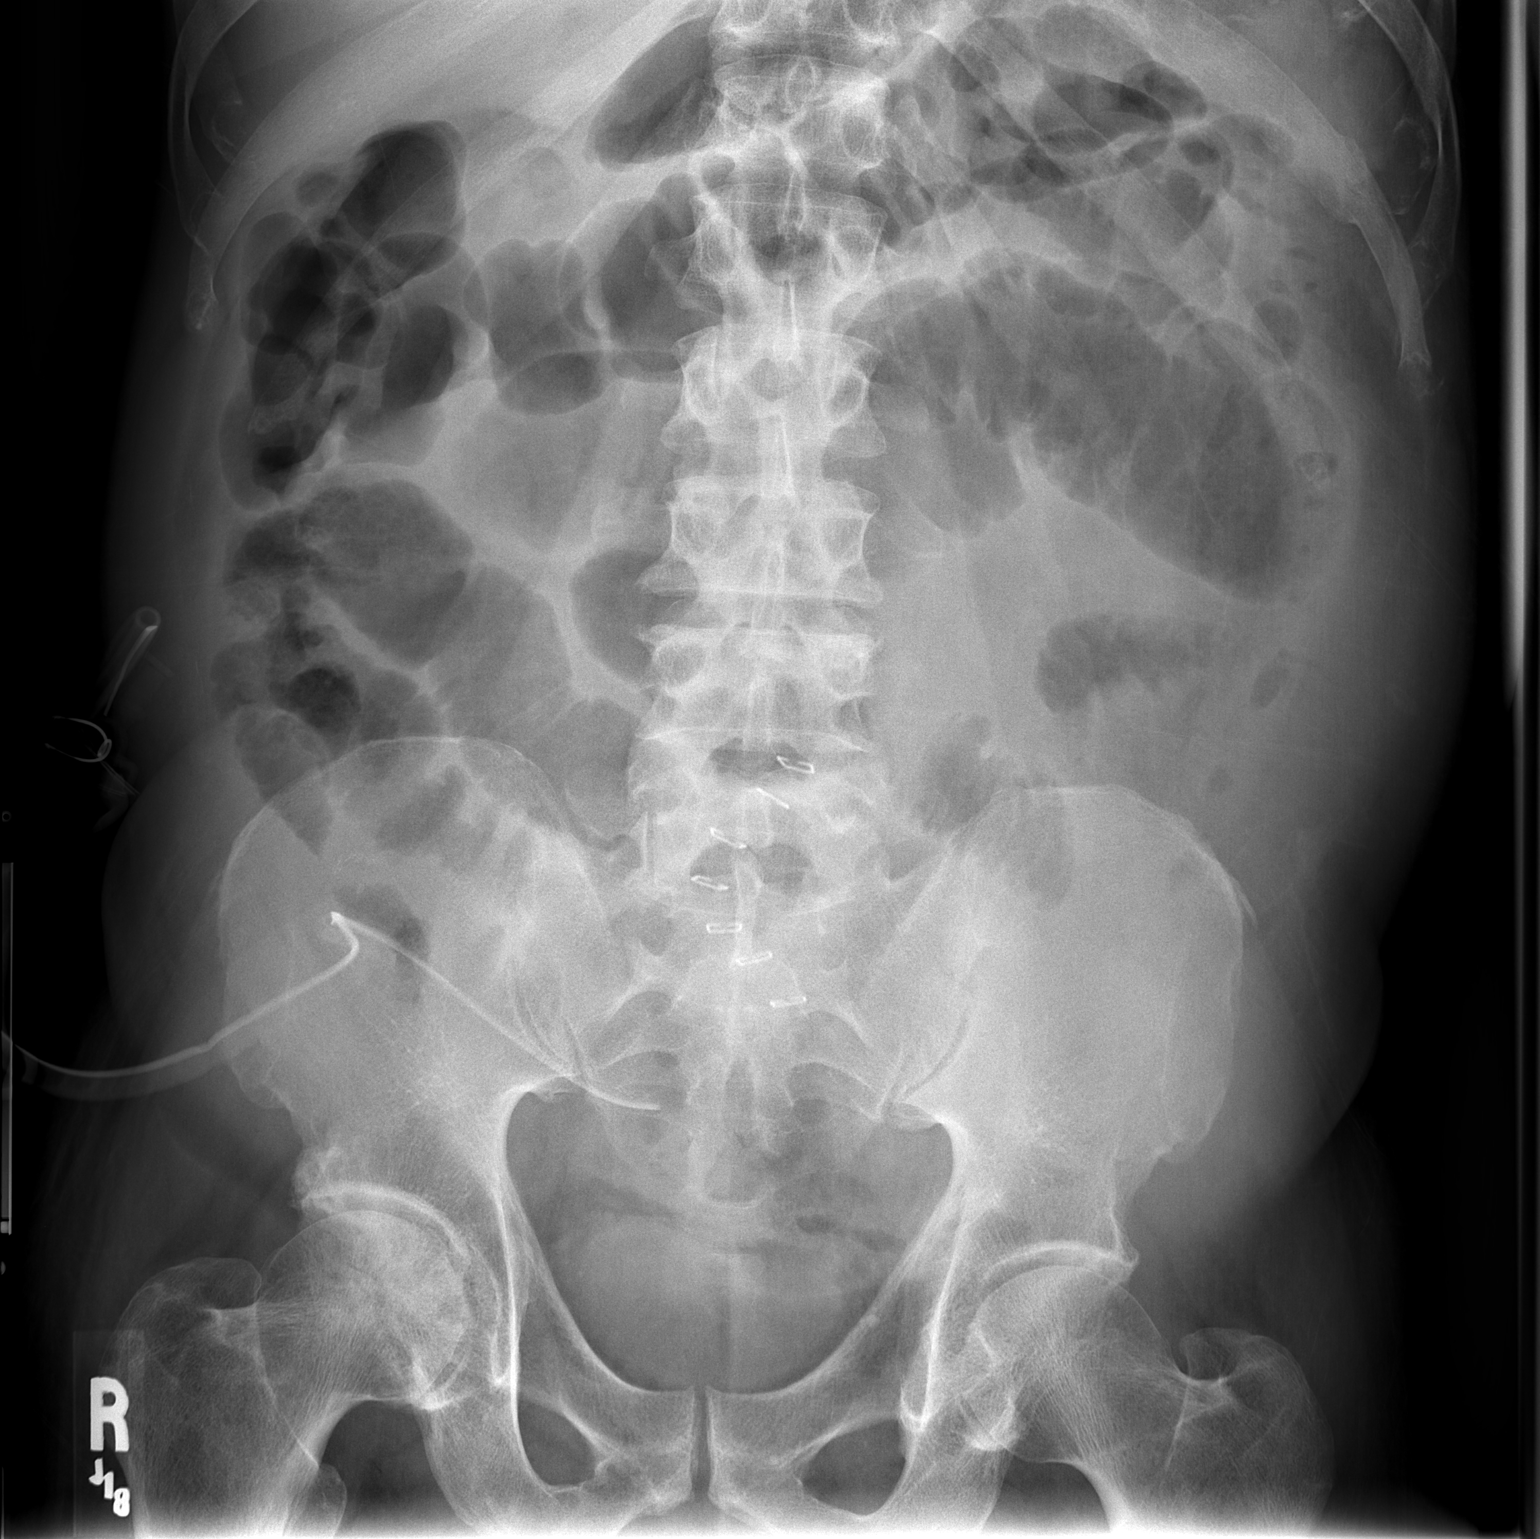

[2 of 2 positions shown; findings below may reference images not displayed]

FINDINGS: There is more gas in the colon.  Small bowel caliber has
diminished slightly.  The pattern suggests improving small bowel
obstruction.  No free air.  No new findings.  Psoas margins remain
intact.  Surgical drain remains positioned in the pelvis.
IMPRESSION: Small bowel obstructive gas pattern improving.

## 2012-04-01 ENCOUNTER — Emergency Department (HOSPITAL_COMMUNITY): Admission: EM | Admit: 2012-04-01 | Discharge: 2012-04-01 | Payer: Medicare Other | Source: Home / Self Care

## 2014-01-25 DIAGNOSIS — Z0000 Encounter for general adult medical examination without abnormal findings: Secondary | ICD-10-CM | POA: Diagnosis not present

## 2014-01-25 DIAGNOSIS — E559 Vitamin D deficiency, unspecified: Secondary | ICD-10-CM | POA: Diagnosis not present

## 2015-03-08 ENCOUNTER — Emergency Department (HOSPITAL_COMMUNITY)
Admission: EM | Admit: 2015-03-08 | Discharge: 2015-03-08 | Disposition: A | Discharge status: Home / Self Care | Payer: Medicare Other | Attending: Emergency Medicine | Admitting: Emergency Medicine

## 2015-03-08 ENCOUNTER — Encounter (HOSPITAL_COMMUNITY): Payer: Self-pay | Admitting: Emergency Medicine

## 2015-03-08 DIAGNOSIS — Z79899 Other long term (current) drug therapy: Secondary | ICD-10-CM | POA: Diagnosis not present

## 2015-03-08 DIAGNOSIS — M545 Low back pain, unspecified: Secondary | ICD-10-CM

## 2015-03-08 MED ORDER — CYCLOBENZAPRINE HCL 10 MG PO TABS: 10.0000 mg | ORAL_TABLET | Freq: Two times a day (BID) | ORAL | Status: AC | PRN

## 2015-03-08 MED ORDER — ACETAMINOPHEN 325 MG PO TABS
650.0000 mg | ORAL_TABLET | Freq: Once | ORAL | Status: AC
  Administered 2015-03-08: 650 mg via ORAL
  Filled 2015-03-08: qty 2

## 2015-03-08 MED FILL — CYCLOBENZAPRINE 10 MG TAB: 10 | 5 days supply | Qty: 10 | Fill #0

## 2015-03-08 NOTE — ED Notes (Signed)
Back pain x 3-4 days. No known inujry.

## 2015-03-08 NOTE — Discharge Instructions (Signed)
Back Pain, Adult °Back pain is very common in adults. The cause of back pain is rarely dangerous and the pain often gets better over time. The cause of your back pain may not be known. Some common causes of back pain include: °1. Strain of the muscles or ligaments supporting the spine. °2. Wear and tear (degeneration) of the spinal disks. °3. Arthritis. °4. Direct injury to the back. °For many people, back pain may return. Since back pain is rarely dangerous, most people can learn to manage this condition on their own. °HOME CARE INSTRUCTIONS °Watch your back pain for any changes. The following actions may help to lessen any discomfort you are feeling: °1. Remain active. It is stressful on your back to sit or stand in one place for long periods of time. Do not sit, drive, or stand in one place for more than 30 minutes at a time. Take short walks on even surfaces as soon as you are able. Try to increase the length of time you walk each day. °2. Exercise regularly as directed by your health care provider. Exercise helps your back heal faster. It also helps avoid future injury by keeping your muscles strong and flexible. °3. Do not stay in bed. Resting more than 1-2 days can delay your recovery. °4. Pay attention to your body when you bend and lift. The most comfortable positions are those that put less stress on your recovering back. Always use proper lifting techniques, including: °1. Bending your knees. °2. Keeping the load close to your body. °3. Avoiding twisting. °5. Find a comfortable position to sleep. Use a firm mattress and lie on your side with your knees slightly bent. If you lie on your back, put a pillow under your knees. °6. Avoid feeling anxious or stressed. Stress increases muscle tension and can worsen back pain. It is important to recognize when you are anxious or stressed and learn ways to manage it, such as with exercise. °7. Take medicines only as directed by your health care provider.  Over-the-counter medicines to reduce pain and inflammation are often the most helpful. Your health care provider may prescribe muscle relaxant drugs. These medicines help dull your pain so you can more quickly return to your normal activities and healthy exercise. °8. Apply ice to the injured area: °1. Put ice in a plastic bag. °2. Place a towel between your skin and the bag. °3. Leave the ice on for 20 minutes, 2-3 times a day for the first 2-3 days. After that, ice and heat may be alternated to reduce pain and spasms. °9. Maintain a healthy weight. Excess weight puts extra stress on your back and makes it difficult to maintain good posture. °SEEK MEDICAL CARE IF: °1. You have pain that is not relieved with rest or medicine. °2. You have increasing pain going down into the legs or buttocks. °3. You have pain that does not improve in one week. °4. You have night pain. °5. You lose weight. °6. You have a fever or chills. °SEEK IMMEDIATE MEDICAL CARE IF:  °1. You develop new bowel or bladder control problems. °2. You have unusual weakness or numbness in your arms or legs. °3. You develop nausea or vomiting. °4. You develop abdominal pain. °5. You feel faint. °  °This information is not intended to replace advice given to you by your health care provider. Make sure you discuss any questions you have with your health care provider. °  °Document Released: 01/05/2005 Document Revised: 01/26/2014 Document Reviewed: 05/09/2013 °Elsevier Interactive Patient Education ©2016 [employer]   [employer] ° °Back Exercises °The following exercises strengthen the muscles that help to support the back. They also help to keep the lower back flexible. Doing these exercises can help to prevent back pain or lessen existing pain. °If you have back pain or discomfort, try doing these exercises 2-3 times each day or as told by your health care provider. When the pain goes away, do them once each day, but increase the number of times that you repeat the  steps for each exercise (do more repetitions). If you do not have back pain or discomfort, do these exercises once each day or as told by your health care provider. °EXERCISES °Single Knee to Chest °Repeat these steps 3-5 times for each leg: °5. Lie on your back on a firm bed or the floor with your legs extended. °6. Bring one knee to your chest. Your other leg should stay extended and in contact with the floor. °7. Hold your knee in place by grabbing your knee or thigh. °8. Pull on your knee until you feel a gentle stretch in your lower back. °9. Hold the stretch for 10-30 seconds. °10. Slowly release and straighten your leg. °Pelvic Tilt °Repeat these steps 5-10 times: °10. Lie on your back on a firm bed or the floor with your legs extended. °11. Bend your knees so they are pointing toward the ceiling and your feet are flat on the floor. °12. Tighten your lower abdominal muscles to press your lower back against the floor. This motion will tilt your pelvis so your tailbone points up toward the ceiling instead of pointing to your feet or the floor. °13. With gentle tension and even breathing, hold this position for 5-10 seconds. °Cat-Cow °Repeat these steps until your lower back becomes more flexible: °7. Get into a hands-and-knees position on a firm surface. Keep your hands under your shoulders, and keep your knees under your hips. You may place padding under your knees for comfort. °8. Let your head hang down, and point your tailbone toward the floor so your lower back becomes rounded like the back of a cat. °9. Hold this position for 5 seconds. °10. Slowly lift your head and point your tailbone up toward the ceiling so your back forms a sagging arch like the back of a cow. °11. Hold this position for 5 seconds. °Press-Ups °Repeat these steps 5-10 times: °6. Lie on your abdomen (face-down) on the floor. °7. Place your palms near your head, about shoulder-width apart. °8. While you keep your back as relaxed as  possible and keep your hips on the floor, slowly straighten your arms to raise the top half of your body and lift your shoulders. Do not use your back muscles to raise your upper torso. You may adjust the placement of your hands to make yourself more comfortable. °9. Hold this position for 5 seconds while you keep your back relaxed. °10. Slowly return to lying flat on the floor. °Bridges °Repeat these steps 10 times: °1. Lie on your back on a firm surface. °2. Bend your knees so they are pointing toward the ceiling and your feet are flat on the floor. °3. Tighten your buttocks muscles and lift your buttocks off of the floor until your waist is at almost the same height as your knees. You should feel the muscles working in your buttocks and the back of your thighs. If you do not feel these muscles, slide your feet 1-2 inches farther away from your buttocks. °4. Hold this   position for 3-5 seconds. °5. Slowly lower your hips to the starting position, and allow your buttocks muscles to relax completely. °If this exercise is too easy, try doing it with your arms crossed over your chest. °Abdominal Crunches °Repeat these steps 5-10 times: °1. Lie on your back on a firm bed or the floor with your legs extended. °2. Bend your knees so they are pointing toward the ceiling and your feet are flat on the floor. °3. Cross your arms over your chest. °4. Tip your chin slightly toward your chest without bending your neck. °5. Tighten your abdominal muscles and slowly raise your trunk (torso) high enough to lift your shoulder blades a tiny bit off of the floor. Avoid raising your torso higher than that, because it can put too much stress on your low back and it does not help to strengthen your abdominal muscles. °6. Slowly return to your starting position. °Back Lifts °Repeat these steps 5-10 times: °1. Lie on your abdomen (face-down) with your arms at your sides, and rest your forehead on the floor. °2. Tighten the muscles in your  legs and your buttocks. °3. Slowly lift your chest off of the floor while you keep your hips pressed to the floor. Keep the back of your head in line with the curve in your back. Your eyes should be looking at the floor. °4. Hold this position for 3-5 seconds. °5. Slowly return to your starting position. °SEEK MEDICAL CARE IF: °· Your back pain or discomfort gets much worse when you do an exercise. °· Your back pain or discomfort does not lessen within 2 hours after you exercise. °If you have any of these problems, stop doing these exercises right away. Do not do them again unless your health care provider says that you can. °SEEK IMMEDIATE MEDICAL CARE IF: °· You develop sudden, severe back pain. If this happens, stop doing the exercises right away. Do not do them again unless your health care provider says that you can. °  °This information is not intended to replace advice given to you by your health care provider. Make sure you discuss any questions you have with your health care provider. °  °Document Released: 02/13/2004 Document Revised: 09/26/2014 Document Reviewed: 03/01/2014 °Elsevier Interactive Patient Education ©2016 [employer] Inc. ° °

## 2015-03-08 NOTE — ED Provider Notes (Signed)
CSN: [id number]     Arrival date & time 03/08/15  1421 History  By signing my name below, I, Garrett Lewis, attest that this documentation has been prepared under the direction and in the presence of Dontrez Pettis, PA-C. Electronically Signed: Elon Lewis ED Scribe. 03/08/2015. 2:55 PM.    Chief Complaint  Patient presents with  . Back Pain   Patient is a 70 y.o. male presenting with back pain. The history is provided by the patient. No language interpreter was used.  Back Pain Location:  Lumbar spine Quality:  Aching Radiates to:  Does not radiate Pain severity:  Mild Onset quality:  Gradual Duration:  3 days Timing:  Intermittent Progression:  Unchanged Context: not falling, not MVA, not recent injury and not twisting   Relieved by: Aspirin. Worsened by:  Standing Associated symptoms: no abdominal pain, no bladder incontinence, no bowel incontinence, no dysuria, no fever, no numbness, no perianal numbness and no weakness     HPI Comments: Garrett Lewis is a 70 y.o. male who presents to the Emergency Department complaining of constant, moderate, minimally improved lower back pain onset 3-4 days ago without injury. He states that the pain is bilateral over his lumbar region. He describes it as aching. The pain is present only when moving from sitting to standing. He states if he is laying, sitting or standing he has no pain at all. The pain does not radiate. He has used ASA with transient relief as well as a weight lifting back brace for support. He denies history of back pain. Denies fevers, chills, myalgias, abdominal pain, dysuria, change in bowel/bladder control, numbness of the lower extremities or weakness of the lower extremities. He has no problems or pain with his gait.       History reviewed. No pertinent past medical history. Past Surgical History  Procedure Laterality Date  . Appendectomy    . Hand surgery      left   Family History  Problem Relation Age of Onset  .  Cancer Mother     breast   Social History  Substance Use Topics  . Smoking status: Never Smoker   . Smokeless tobacco: Never Used  . Alcohol Use: Yes     Comment: occasional    Review of Systems  Constitutional: Negative for fever.  Gastrointestinal: Negative for abdominal pain and bowel incontinence.  Genitourinary: Negative for bladder incontinence and dysuria.  Musculoskeletal: Positive for back pain.  Neurological: Negative for weakness and numbness.  All other systems reviewed and are negative.     Allergies  Review of patient's allergies indicates no known allergies.  Home Medications   Prior to Admission medications   Medication Sig Start Date End Date Taking? Authorizing Provider  cyclobenzaprine (FLEXERIL) 10 MG tablet Take 1 tablet (10 mg total) by mouth 2 (two) times daily as needed for muscle spasms. 03/08/15   Harlym Gehling, PA-C  fish oil-omega-3 fatty acids 1000 MG capsule Take 2 g by mouth daily.      Historical Provider, MD  folic acid (FOLVITE) 1 MG tablet Take 1 mg by mouth daily.      Historical Provider, MD   BP 132/77 mmHg  Pulse 89  Temp(Src) 98.4 F (36.9 C) (Oral)  Resp 18  SpO2 99% Physical Exam  Constitutional: He is oriented to person, place, and time. He appears well-developed and well-nourished. No distress.  HENT:  Head: Normocephalic and atraumatic.  Eyes: Conjunctivae and EOM are normal.  Neck: Neck supple.  No tracheal deviation present.  Cardiovascular: Normal rate and intact distal pulses.   Pedal pulse palpable.   Pulmonary/Chest: Effort normal. No respiratory distress.  Musculoskeletal: Normal range of motion.       Lumbar back: He exhibits tenderness. He exhibits normal range of motion, no edema and no deformity.       Back:  Generalized tenderness over the lumbar region. No focal tenderness of the lumbar spine. No bony deformities or step offs. FROM of the thoracic and lumbar spine intact. Pt ambulates with a steady gait. FROM  of BLE.   Neurological: He is alert and oriented to person, place, and time.  5/5 strength of the bilateral lower extremities. Sensation to light touch intact throughout. No numbness.   Skin: Skin is warm and dry. No rash noted.  No rash or color change noted of the lumbar region   Psychiatric: He has a normal mood and affect. His behavior is normal.  Nursing note and vitals reviewed.   ED Course  Procedures (including critical care time)  DIAGNOSTIC STUDIES: Oxygen Saturation is 99% on RA, normal by my interpretation.    COORDINATION OF CARE:  3:08 PM Will order anti-inflammatory and prescribe muscle relaxant.  Patient acknowledges and agrees with plan.    Labs Review Labs Reviewed - No data to display  Imaging Review No results found. I have personally reviewed and evaluated these images and lab results as part of my medical decision-making.   EKG Interpretation None      MDM   Final diagnoses:  Bilateral low back pain without sciatica   Patient presenting with back pain x 3 days. Afebrile. Non-focal neuro exam. Generalized tenderness over lumbar region. BLE neurovascularly intact with FROM. Patient is able to ambulate without discomfort. No loss of bowel or bladder control. No numbness or weakness in the lower extremities. No concern for cauda equina. No history of IVDU or cancer. Presentation consistent with a musculoskeletal strain. Conservative therapy including back exercises, heat, ice, tylenol or ibuprofen discussed. Will give muscle relaxer for back spasms. Discussed side effects of muscle relaxer and avoiding driving. Return precautions discussed and given in discharge paperwork. Pt is stable for discharge.    I personally performed the services described in this documentation, which was scribed in my presence. The recorded information has been reviewed and is accurate.    Rolm Gala Maze Corniel, PA-C 03/08/15 2053  Mancel Bale, MD 03/09/15 [phone]

## 2016-07-29 DIAGNOSIS — Z189 Retained foreign body fragments, unspecified material: Secondary | ICD-10-CM | POA: Diagnosis not present

## 2016-07-29 DIAGNOSIS — T8189XA Other complications of procedures, not elsewhere classified, initial encounter: Secondary | ICD-10-CM | POA: Diagnosis not present

## 2016-11-30 ENCOUNTER — Other Ambulatory Visit: Payer: Self-pay

## 2016-11-30 ENCOUNTER — Emergency Department (HOSPITAL_COMMUNITY)
Admission: EM | Admit: 2016-11-30 | Discharge: 2016-11-30 | Disposition: A | Discharge status: Home / Self Care | Payer: Medicare Other | Attending: Emergency Medicine | Admitting: Emergency Medicine

## 2016-11-30 ENCOUNTER — Encounter (HOSPITAL_COMMUNITY): Payer: Self-pay | Admitting: Emergency Medicine

## 2016-11-30 ENCOUNTER — Emergency Department (HOSPITAL_COMMUNITY): Payer: Medicare Other

## 2016-11-30 DIAGNOSIS — Z79899 Other long term (current) drug therapy: Secondary | ICD-10-CM | POA: Diagnosis not present

## 2016-11-30 DIAGNOSIS — L03818 Cellulitis of other sites: Secondary | ICD-10-CM | POA: Diagnosis not present

## 2016-11-30 DIAGNOSIS — L089 Local infection of the skin and subcutaneous tissue, unspecified: Secondary | ICD-10-CM | POA: Diagnosis not present

## 2016-11-30 DIAGNOSIS — N492 Inflammatory disorders of scrotum: Secondary | ICD-10-CM

## 2016-11-30 DIAGNOSIS — L02818 Cutaneous abscess of other sites: Secondary | ICD-10-CM | POA: Diagnosis not present

## 2016-11-30 DIAGNOSIS — T63301A Toxic effect of unspecified spider venom, accidental (unintentional), initial encounter: Secondary | ICD-10-CM | POA: Diagnosis not present

## 2016-11-30 DIAGNOSIS — S30873A Other superficial bite of scrotum and testes, initial encounter: Secondary | ICD-10-CM | POA: Diagnosis not present

## 2016-11-30 LAB — CBC WITH DIFFERENTIAL/PLATELET
BASOS ABS: 0 10*3/uL (ref 0.0–0.1)
Basophils Relative: 0 %
Eosinophils Absolute: 0.4 10*3/uL (ref 0.0–0.7)
Eosinophils Relative: 3 %
HCT: 47 % (ref 39.0–52.0)
HEMOGLOBIN: 15.4 g/dL (ref 13.0–17.0)
LYMPHS PCT: 13 %
Lymphs Abs: 1.7 10*3/uL (ref 0.7–4.0)
MCH: 27.8 pg (ref 26.0–34.0)
MCHC: 32.8 g/dL (ref 30.0–36.0)
MCV: 84.8 fL (ref 78.0–100.0)
MONO ABS: 1.1 10*3/uL — AB (ref 0.1–1.0)
MONOS PCT: 8 %
NEUTROS ABS: 10.4 10*3/uL — AB (ref 1.7–7.7)
NEUTROS PCT: 76 %
Platelets: 305 10*3/uL (ref 150–400)
RBC: 5.54 MIL/uL (ref 4.22–5.81)
RDW: 14.6 % (ref 11.5–15.5)
WBC: 13.6 10*3/uL — ABNORMAL HIGH (ref 4.0–10.5)

## 2016-11-30 LAB — BASIC METABOLIC PANEL
ANION GAP: 8 (ref 5–15)
BUN: 18 mg/dL (ref 6–20)
CALCIUM: 9 mg/dL (ref 8.9–10.3)
CO2: 27 mmol/L (ref 22–32)
Chloride: 103 mmol/L (ref 101–111)
Creatinine, Ser: 1.29 mg/dL — ABNORMAL HIGH (ref 0.61–1.24)
GFR calc non Af Amer: 55 mL/min — ABNORMAL LOW (ref >60)
GLUCOSE: 130 mg/dL — AB (ref 65–99)
Potassium: 3.7 mmol/L (ref 3.5–5.1)
Sodium: 138 mmol/L (ref 135–145)

## 2016-11-30 MED ORDER — SULFAMETHOXAZOLE-TRIMETHOPRIM 800-160 MG PO TABS: 1.0000 | ORAL_TABLET | Freq: Two times a day (BID) | ORAL | 0 refills | Status: AC

## 2016-11-30 MED ORDER — SULFAMETHOXAZOLE-TRIMETHOPRIM 800-160 MG PO TABS
1.0000 | ORAL_TABLET | Freq: Once | ORAL | Status: AC
  Administered 2016-11-30: 1 via ORAL
  Filled 2016-11-30: qty 1

## 2016-11-30 MED ORDER — IBUPROFEN 600 MG PO TABS: 600.0000 mg | ORAL_TABLET | Freq: Four times a day (QID) | ORAL | 0 refills | Status: DC | PRN

## 2016-11-30 MED ORDER — TRAMADOL HCL 50 MG PO TABS
50.0000 mg | ORAL_TABLET | Freq: Four times a day (QID) | ORAL | 0 refills | Status: AC | PRN
Start: 2016-11-30 — End: ?

## 2016-11-30 NOTE — ED Provider Notes (Signed)
[facility] EMERGENCY DEPARTMENT Provider Note   CSN: [id number] Arrival date & time: 11/30/16  1648     History   Chief Complaint Chief Complaint  Patient presents with  . Insect Bite    HPI Garrett Lewis is a 71 y.o. male.  HPI  71 y.o. male, presents to the Emergency Department today from [facility] due to scrotal swelling with drainage. Noted symptom onset Saturday. [facility] physician believed to be related to [facility] recluse and sent to ED for antidote. Pt rates pain 4/10. Throbbing sensation. Denies testicular pain. No change in urination. No hematuria. No fevers. No meds PTA. No other symptoms noted   History reviewed. No pertinent past medical history.  Patient Active Problem List   Diagnosis Date Noted  . Chronic wound of head, neck, or trunk 04/29/2011  . Appendicitis with peritoneal abscess 07/29/2010    Past Surgical History:  Procedure Laterality Date  . APPENDECTOMY    . HAND SURGERY     left       Home Medications    Prior to Admission medications   Medication Sig Start Date End Date Taking? Authorizing Provider  cyclobenzaprine (FLEXERIL) 10 MG tablet Take 1 tablet (10 mg total) by mouth 2 (two) times daily as needed for muscle spasms. 03/08/15   Barrett, Rolm Gala, PA-C  fish oil-omega-3 fatty acids 1000 MG capsule Take 2 g by mouth daily.      [provider]  folic acid (FOLVITE) 1 MG tablet Take 1 mg by mouth daily.      [provider]    Family History Family History  Problem Relation Age of Onset  . Cancer Mother        breast    Social History Social History   Tobacco Use  . Smoking status: Never Smoker  . Smokeless tobacco: Never Used  Substance Use Topics  . Alcohol use: Yes    Comment: occasional  . Drug use: No     Allergies   Patient has no known allergies.   Review of Systems Review of Systems ROS reviewed and all are negative for acute change except as noted in the HPI.  Physical  Exam Updated Vital Signs BP 130/66 (BP Location: Right Arm)   Pulse 83   Temp 98.2 F (36.8 C) (Oral)   Resp 18   Ht 5\' 5"  (1.651 m)   Wt 73.5 kg (162 lb)   SpO2 100%   BMI 26.96 kg/m   Physical Exam  Constitutional: He is oriented to person, place, and time. Vital signs are normal. He appears well-developed and well-nourished.  HENT:  Head: Normocephalic.  Right Ear: Hearing normal.  Left Ear: Hearing normal.  Eyes: Conjunctivae and EOM are normal. Pupils are equal, round, and reactive to light.  Neck: Normal range of motion. Neck supple.  Cardiovascular: Normal rate, regular rhythm, normal heart sounds and intact distal pulses.  Pulmonary/Chest: Effort normal.  Abdominal: Soft.  Genitourinary: Testes normal.  Genitourinary Comments: TTP posterior/superior left scrotum. Mild induration. No fluctuance. Minimal drainage with purulence. No erythema. Testicles non TTP. No induration or fluctuance palpable along perineum   Musculoskeletal: Normal range of motion.  Neurological: He is alert and oriented to person, place, and time.  Skin: Skin is warm and dry.  Psychiatric: He has a normal mood and affect. His speech is normal and behavior is normal. Thought content normal.  Nursing note and vitals reviewed.  ED Treatments / Results  Labs (all labs ordered are  listed, but only abnormal results are displayed) Labs Reviewed  CBC WITH DIFFERENTIAL/PLATELET - Abnormal; Notable for the following components:      Result Value   WBC 13.6 (*)    Neutro Abs 10.4 (*)    Monocytes Absolute 1.1 (*)    All other components within normal limits  BASIC METABOLIC PANEL - Abnormal; Notable for the following components:   Glucose, Bld 130 (*)    Creatinine, Ser 1.29 (*)    GFR calc non Af Amer 55 (*)    All other components within normal limits    EKG  EKG Interpretation None       Radiology [place] Scrotum  Result Date: 11/30/2016 CLINICAL DATA:  Recent insect bite.  Concern for  scrotal abscess. EXAM: SCROTAL ULTRASOUND DOPPLER ULTRASOUND OF THE TESTICLES TECHNIQUE: Complete ultrasound examination of the testicles, epididymis, and other scrotal structures was performed. Color and spectral Doppler ultrasound were also utilized to evaluate blood flow to the testicles. COMPARISON:  None. FINDINGS: Right testicle Measurements: 2.5 x 2.9 x 4.8 cm. No mass or microlithiasis visualized. Left testicle Measurements: 2.5 x 2.4 x 4.8 cm. No mass or microlithiasis visualized. Right epididymis:  Normal in size and appearance. Left epididymis:  Normal in size and appearance. Hydrocele:  None visualized. Varicocele:  None visualized. Pulsed Doppler interrogation of both testes demonstrates normal low resistance arterial and venous waveforms bilaterally. Mild soft tissue swelling in the area of the spider bite. No drainable fluid collection. IMPRESSION: 1. Mild soft tissue swelling in the area of the spider bite without drainable fluid collection. 2. Normal sonographic appearance of the bilateral testes. Electronically Signed   By: Obie DredgeWilliam T Derry M.D.   On: 11/30/2016 18:27   [place] Art/ven Flow Abd Pelv Doppler  Result Date: 11/30/2016 CLINICAL DATA:  Recent insect bite.  Concern for scrotal abscess. EXAM: SCROTAL ULTRASOUND DOPPLER ULTRASOUND OF THE TESTICLES TECHNIQUE: Complete ultrasound examination of the testicles, epididymis, and other scrotal structures was performed. Color and spectral Doppler ultrasound were also utilized to evaluate blood flow to the testicles. COMPARISON:  None. FINDINGS: Right testicle Measurements: 2.5 x 2.9 x 4.8 cm. No mass or microlithiasis visualized. Left testicle Measurements: 2.5 x 2.4 x 4.8 cm. No mass or microlithiasis visualized. Right epididymis:  Normal in size and appearance. Left epididymis:  Normal in size and appearance. Hydrocele:  None visualized. Varicocele:  None visualized. Pulsed Doppler interrogation of both testes demonstrates normal low resistance  arterial and venous waveforms bilaterally. Mild soft tissue swelling in the area of the spider bite. No drainable fluid collection. IMPRESSION: 1. Mild soft tissue swelling in the area of the spider bite without drainable fluid collection. 2. Normal sonographic appearance of the bilateral testes. Electronically Signed   By: Obie DredgeWilliam T Derry M.D.   On: 11/30/2016 18:27    Procedures Procedures (including critical care time)  Medications Ordered in ED Medications  sulfamethoxazole-trimethoprim (BACTRIM DS,SEPTRA DS) 800-160 MG per tablet 1 tablet (1 tablet Oral Given 11/30/16 1913)     Initial Impression / Assessment and Plan / ED Course  I have reviewed the triage vital signs and the nursing notes.  Pertinent labs & imaging results that were available during my care of the patient were reviewed by me and considered in my medical decision making (see chart for details).  Final Clinical Impressions(s) / ED Diagnoses  {I have reviewed and evaluated the relevant laboratory values. {I have reviewed and evaluated the relevant imaging studies.  {I have reviewed the relevant previous healthcare  records.  {I obtained HPI from historian. {Patient discussed with supervising physician.  ED Course:  Assessment: Pt is a 71 y.o. male [facility] with drainage. Noted symptom onset Saturday. [facility] for [facility]. Pt rates pain 4/10. Throbbing sensation. Denies testicular pain. No change in urination. No hematuria. No fevers. No meds PTA. On exam, pt in NAD. Nontoxic/nonseptic appearing. VSS. Afebrile. Lungs CTA. Heart RRR. Abdomen nontender soft. GU Exam TTP posterior/superior left scrotum. Mild induration. No fluctuance. Minimal drainage with purulence. No erythema. Testicles non TTP. No induration or fluctuance palpable along perineum . CBC with mild leukocytosis. BMP unremarkable.  Scrotal US without evidence of abscess. No drainable fluid collection. Mild soft tissue swelling noted. Given ABX in ED. Discussed with attending physician. Dapsone not indicated. Will RX BActrim. Plan is to DC home with Urology follow up. At time of discharge, Patient is in no acute distress. Vital Signs are stable. Patient is able to ambulate. Patient able to tolerate PO.   Disposition/Plan:  DC Home Additional Verbal discharge instructions given and discussed with patient.  Pt Instructed to f/u with PCP in the next week for evaluation and treatment of symptoms. Return precautions given Pt acknowledges and agrees with plan  Supervising Physician Raeford RazorKohut, Stephen, MD  Final diagnoses:  Spider bite wound, accidental or unintentional, initial encounter  Scrotal infection    ED Discharge Orders    None       Audry PiliMohr, Keasia Dubose, PA-C 11/30/16 1920    Raeford RazorKohut, Stephen, MD 12/01/16 1037

## 2016-11-30 NOTE — ED Triage Notes (Signed)
On exam, testicles swollen and draining.

## 2016-11-30 NOTE — ED Provider Notes (Signed)
MSE was initiated and I personally evaluated the patient and placed orders (if any) at  5:14 PM on November 30, 2016.  Lesion to scrotum with some firmness. He thinks it could be spider bite, saw a spider come out of his pants then a couple days later felt this spot. Will get ultrasound. Basic labs. No known immunocompromise.  The patient appears stable so that the remainder of the MSE may be completed by another provider.   Pricilla LovelessGoldston, Kalysta Kneisley, MD 11/30/16 [phone]

## 2016-11-30 NOTE — ED Notes (Signed)
Pt called x2 for vitals, no response. 

## 2016-11-30 NOTE — Discharge Instructions (Signed)
Please read and follow all provided instructions.  Your diagnoses today include:  1. Spider bite wound, accidental or unintentional, initial encounter   2. Scrotal infection     Tests performed today include: Vital signs. See below for your results today.   Medications prescribed:  Take as prescribed   Home care instructions:  Follow any educational materials contained in this packet.  Follow-up instructions: Please follow-up with your Urology for further evaluation of symptoms and treatment   Return instructions:  Please return to the Emergency Department if you do not get better, if you get worse, or new symptoms OR  - Fever (temperature greater than 101.73F)  - Bleeding that does not stop with holding pressure to the area    -Severe pain (please note that you may be more sore the day after your accident)  - Chest Pain  - Difficulty breathing  - Severe nausea or vomiting  - Inability to tolerate food and liquids  - Passing out  - Skin becoming red around your wounds  - Change in mental status (confusion or lethargy)  - New numbness or weakness    Please return if you have any other emergent concerns.  Additional Information:  Your vital signs today were: BP 130/66 (BP Location: Right Arm)    Pulse 83    Temp 98.2 F (36.8 C) (Oral)    Resp 18    Ht 5\' 5"  (1.651 m)    Wt 73.5 kg (162 lb)    SpO2 100%    BMI 26.96 kg/m  If your blood pressure (BP) was elevated above 135/85 this visit, please have this repeated by your doctor within one month. ---------------

## 2016-11-30 NOTE — ED Triage Notes (Signed)
[facility] recluse bite to testicles, states occurred Saturday, states skin is degrading and was sent here for antidote

## 2016-11-30 NOTE — ED Notes (Addendum)
Pt called to be taken back to the room. No answer. Pt may be in ultrasound.

## 2016-12-02 DIAGNOSIS — T63301A Toxic effect of unspecified spider venom, accidental (unintentional), initial encounter: Secondary | ICD-10-CM | POA: Diagnosis not present

## 2016-12-02 DIAGNOSIS — T8149XA Infection following a procedure, other surgical site, initial encounter: Secondary | ICD-10-CM | POA: Diagnosis not present

## 2016-12-08 DIAGNOSIS — Z23 Encounter for immunization: Secondary | ICD-10-CM | POA: Diagnosis not present

## 2016-12-21 DIAGNOSIS — T63301A Toxic effect of unspecified spider venom, accidental (unintentional), initial encounter: Secondary | ICD-10-CM | POA: Diagnosis not present

## 2016-12-21 DIAGNOSIS — Z713 Dietary counseling and surveillance: Secondary | ICD-10-CM | POA: Diagnosis not present

## 2016-12-21 DIAGNOSIS — T8149XA Infection following a procedure, other surgical site, initial encounter: Secondary | ICD-10-CM | POA: Diagnosis not present

## 2017-10-01 DIAGNOSIS — T8130XA Disruption of wound, unspecified, initial encounter: Secondary | ICD-10-CM | POA: Diagnosis not present

## 2018-09-15 DIAGNOSIS — R140 Abdominal distension (gaseous): Secondary | ICD-10-CM | POA: Diagnosis not present

## 2018-09-15 DIAGNOSIS — L0390 Cellulitis, unspecified: Secondary | ICD-10-CM | POA: Diagnosis not present

## 2019-03-18 ENCOUNTER — Encounter (HOSPITAL_COMMUNITY): Payer: Self-pay

## 2019-03-18 ENCOUNTER — Other Ambulatory Visit: Payer: Self-pay

## 2019-03-18 ENCOUNTER — Emergency Department (HOSPITAL_COMMUNITY)
Admission: EM | Admit: 2019-03-18 | Discharge: 2019-03-18 | Disposition: A | Discharge status: Home / Self Care | Payer: Medicare Other | Attending: Emergency Medicine | Admitting: Emergency Medicine

## 2019-03-18 ENCOUNTER — Emergency Department (HOSPITAL_COMMUNITY): Payer: Medicare Other

## 2019-03-18 DIAGNOSIS — L02211 Cutaneous abscess of abdominal wall: Secondary | ICD-10-CM | POA: Diagnosis not present

## 2019-03-18 DIAGNOSIS — Z79899 Other long term (current) drug therapy: Secondary | ICD-10-CM | POA: Diagnosis not present

## 2019-03-18 DIAGNOSIS — L989 Disorder of the skin and subcutaneous tissue, unspecified: Secondary | ICD-10-CM | POA: Diagnosis present

## 2019-03-18 DIAGNOSIS — R188 Other ascites: Secondary | ICD-10-CM | POA: Diagnosis not present

## 2019-03-18 LAB — CBC
HCT: 46.7 % (ref 39.0–52.0)
Hemoglobin: 14.8 g/dL (ref 13.0–17.0)
MCH: 26.9 pg (ref 26.0–34.0)
MCHC: 31.7 g/dL (ref 30.0–36.0)
MCV: 84.8 fL (ref 80.0–100.0)
Platelets: 311 10*3/uL (ref 150–400)
RBC: 5.51 MIL/uL (ref 4.22–5.81)
RDW: 14 % (ref 11.5–15.5)
WBC: 11.7 10*3/uL — ABNORMAL HIGH (ref 4.0–10.5)
nRBC: 0 % (ref 0.0–0.2)

## 2019-03-18 LAB — COMPREHENSIVE METABOLIC PANEL
ALT: 11 U/L (ref 0–44)
AST: 18 U/L (ref 15–41)
Albumin: 3.7 g/dL (ref 3.5–5.0)
Alkaline Phosphatase: 59 U/L (ref 38–126)
Anion gap: 10 (ref 5–15)
BUN: 16 mg/dL (ref 8–23)
CO2: 23 mmol/L (ref 22–32)
Calcium: 8.7 mg/dL — ABNORMAL LOW (ref 8.9–10.3)
Chloride: 104 mmol/L (ref 98–111)
Creatinine, Ser: 1.28 mg/dL — ABNORMAL HIGH (ref 0.61–1.24)
GFR calc Af Amer: >60 mL/min (ref >60)
GFR calc non Af Amer: 56 mL/min — ABNORMAL LOW (ref >60)
Glucose, Bld: 91 mg/dL (ref 70–99)
Potassium: 4 mmol/L (ref 3.5–5.1)
Sodium: 137 mmol/L (ref 135–145)
Total Bilirubin: 0.7 mg/dL (ref 0.3–1.2)
Total Protein: 7.5 g/dL (ref 6.5–8.1)

## 2019-03-18 LAB — LIPASE, BLOOD: Lipase: 35 U/L (ref 11–51)

## 2019-03-18 MED ORDER — SULFAMETHOXAZOLE-TRIMETHOPRIM 800-160 MG PO TABS
1.0000 | ORAL_TABLET | Freq: Once | ORAL | Status: AC
  Administered 2019-03-18: 20:00:00 1 via ORAL
  Filled 2019-03-18: qty 1

## 2019-03-18 MED ORDER — SULFAMETHOXAZOLE-TRIMETHOPRIM 800-160 MG PO TABS: 1.0000 | ORAL_TABLET | Freq: Two times a day (BID) | ORAL | 0 refills | Status: AC

## 2019-03-18 MED ORDER — IOHEXOL 300 MG/ML  SOLN
100.0000 mL | Freq: Once | INTRAMUSCULAR | Status: AC | PRN
  Administered 2019-03-18: 100 mL via INTRAVENOUS

## 2019-03-18 NOTE — Discharge Instructions (Signed)
As discussed, you have been diagnosed with an abdominal wall abscess.  It is board to take all medication as directed, and use warm compresses to facilitate healing.  If you develop new, or concerning changes, be sure to return here.  Otherwise, please be sure to follow-up with your surgeons team at their office within the next week for follow-up and appropriate ongoing care.

## 2019-03-18 NOTE — ED Triage Notes (Signed)
Pt reports swelling and redness at umbilicus for the past 2 days. Pt has hx of appendectomy about 5 years ago, redness and swelling noted at scar. Pt denies fever/chills. No n.v

## 2019-03-18 NOTE — ED Provider Notes (Signed)
[facility] EMERGENCY DEPARTMENT Provider Note   CSN: [id number] Arrival date & time: 03/18/19  1539     History Chief Complaint  Patient presents with  . Abdominal Pain    Garrett Lewis is a 74 y.o. male.  HPI    Patient presents with concern of drainage and skin color changes from his abdomen. He notes a history of appendicitis 4 years ago with abscess, requiring surgery.  He was in his usual state of health until about 2 days ago, when he noticed after some awkward positioning that he had new erythema about the midline abdomen.  Subsequently there was drainage of the small amounts of fluid.  No other changes including fever, vomiting, pain anywhere else.  Indeed, the pain in his abdomen is only present with palpation, abrasion. No medication taken for relief.  History reviewed. No pertinent past medical history.  Patient Active Problem List   Diagnosis Date Noted  . Chronic wound of head, neck, or trunk 04/29/2011  . Appendicitis with peritoneal abscess 07/29/2010    Past Surgical History:  Procedure Laterality Date  . APPENDECTOMY    . HAND SURGERY     left       Family History  Problem Relation Age of Onset  . Cancer Mother        breast    Social History   Tobacco Use  . Smoking status: Never Smoker  . Smokeless tobacco: Never Used  Substance Use Topics  . Alcohol use: Yes    Comment: occasional  . Drug use: No    Home Medications Prior to Admission medications   Medication Sig Start Date End Date Taking? Authorizing Provider  cyclobenzaprine (FLEXERIL) 10 MG tablet Take 1 tablet (10 mg total) by mouth 2 (two) times daily as needed for muscle spasms. 03/08/15   Barrett, Rolm Gala, PA-C  fish oil-omega-3 fatty acids 1000 MG capsule Take 2 g by mouth daily.      [provider]  folic acid (FOLVITE) 1 MG tablet Take 1 mg by mouth daily.      [provider]  ibuprofen (ADVIL,MOTRIN) 600 MG tablet Take 1 tablet (600 mg  total) every 6 (six) hours as needed by mouth. 11/30/16   Audry Pili, PA-C  sulfamethoxazole-trimethoprim (BACTRIM DS) 800-160 MG tablet Take 1 tablet by mouth 2 (two) times daily for 7 days. 03/18/19 03/25/19  Gerhard Munch, MD  traMADol (ULTRAM) 50 MG tablet Take 1 tablet (50 mg total) every 6 (six) hours as needed by mouth. 11/30/16   Audry Pili, PA-C    Allergies    Patient has no known allergies.  Review of Systems   Review of Systems  Constitutional:       Per HPI, otherwise negative  HENT:       Per HPI, otherwise negative  Respiratory:       Per HPI, otherwise negative  Cardiovascular:       Per HPI, otherwise negative  Gastrointestinal: Negative for vomiting.  Endocrine:       Negative aside from HPI  Genitourinary:       Neg aside from HPI   Musculoskeletal:       Per HPI, otherwise negative  Skin: Negative.   Neurological: Negative for syncope.    Physical Exam Updated Vital Signs BP (!) 141/89   Pulse 79   Temp 98.4 F (36.9 C) (Oral)   Resp (!) 22   Ht 5\' 5"  (1.651 m)   Wt 72.6 kg  SpO2 98%   BMI 26.63 kg/m   Physical Exam Vitals and nursing note reviewed.  Constitutional:      General: He is not in acute distress.    Appearance: He is well-developed.  HENT:     Head: Normocephalic and atraumatic.  Eyes:     Conjunctiva/sclera: Conjunctivae normal.  Cardiovascular:     Rate and Rhythm: Normal rate and regular rhythm.  Pulmonary:     Effort: Pulmonary effort is normal. No respiratory distress.     Breath sounds: No stridor.  Abdominal:     General: There is no distension.     Tenderness: There is no abdominal tenderness.    Skin:    General: Skin is warm and dry.  Neurological:     Mental Status: He is alert and oriented to person, place, and time.     ED Results / Procedures / Treatments   Labs (all labs ordered are listed, but only abnormal results are displayed) Labs Reviewed  COMPREHENSIVE METABOLIC PANEL - Abnormal; Notable  for the following components:      Result Value   Creatinine, Ser 1.28 (*)    Calcium 8.7 (*)    GFR calc non Af Amer 56 (*)    All other components within normal limits  CBC - Abnormal; Notable for the following components:   WBC 11.7 (*)    All other components within normal limits  LIPASE, BLOOD    EKG None  Radiology CT Abdomen Pelvis W Contrast  Result Date: 03/18/2019 CLINICAL DATA:  History of open appendectomy, midline. Early drainage the first which EXAM: CT ABDOMEN AND PELVIS WITH CONTRAST TECHNIQUE: Multidetector CT imaging of the abdomen and pelvis was performed using the standard protocol following bolus administration of intravenous contrast. CONTRAST:  OMNIPAQUE IOHEXOL 300 MG/ML  SOLN COMPARISON:  None. FINDINGS: Lower chest: The visualized heart size within normal limits. No pericardial fluid/thickening. No hiatal hernia. The visualized portions of the lungs are clear. Hepatobiliary: The liver is normal in density without focal abnormality.The main portal vein is patent. No evidence of calcified gallstones, gallbladder wall thickening or biliary dilatation. Pancreas: Unremarkable. No pancreatic ductal dilatation or surrounding inflammatory changes. Spleen: Normal in size without focal abnormality. Adrenals/Urinary Tract: Both adrenal glands appear normal. The kidneys and collecting system appear normal without evidence of urinary tract calculus or hydronephrosis. Bladder is unremarkable. Stomach/Bowel: The stomach, small bowel, and colon are normal in appearance. No inflammatory changes, wall thickening, or obstructive findings.Surgical suture in the right lower quadrant. Vascular/Lymphatic: There are no enlarged mesenteric, retroperitoneal, or pelvic lymph nodes. Scattered aortic atherosclerotic calcifications are seen without aneurysmal dilatation. Reproductive: The prostate is unremarkable. Other: Along the anterior abdominal wall within the midline there is postsurgical  changes. There is also non loculated heterogeneous fluid within the subcutaneous tissues, best seen on series 3, image 40. This abuts the surface of the anterior abdominal wall deep fascial layer. There is overlying skin defect from the postsurgical changes. Musculoskeletal: No acute or significant osseous findings. IMPRESSION: Along the anterior abdominal wall postsurgical changes with non loculated heterogeneous fluid which could be phlegmon/early abscess. No intra-abdominal abscess is seen. Aortic Atherosclerosis (ICD10-I70.0). Electronically Signed   By: Jonna Clark M.D.   On: 03/18/2019 19:47    Procedures Procedures (including critical care time)  Medications Ordered in ED Medications  iohexol (OMNIPAQUE) 300 MG/ML solution 100 mL (100 mLs Intravenous Contrast Given 03/18/19 1921)  sulfamethoxazole-trimethoprim (BACTRIM DS) 800-160 MG per tablet 1 tablet (1 tablet Oral  Given 03/18/19 2026)    ED Course  I have reviewed the triage vital signs and the nursing notes.  Pertinent labs & imaging results that were available during my care of the patient were reviewed by me and considered in my medical decision making (see chart for details).  On repeat exam the patient is awake, alert.  We discussed all findings, including reassuring CT scan, though with evidence for small subcutaneous abscess, though without involvement of the intraperitoneal space.  I have also discussed patient's case with our surgical colleagues, given his history of open procedure. Absent distress, with reassuring labs, no evidence for peritonitis, patient is appropriate for initiation of oral antibiotics, close follow-up in the surgery clinic. Final Clinical Impression(s) / ED Diagnoses Final diagnoses:  Abscess of abdominal wall    Rx / DC Orders ED Discharge Orders         Ordered    sulfamethoxazole-trimethoprim (BACTRIM DS) 800-160 MG tablet  2 times daily     03/18/19 2043           Carmin Muskrat, MD  03/19/19 0002

## 2019-11-16 ENCOUNTER — Other Ambulatory Visit: Payer: Self-pay

## 2019-11-16 ENCOUNTER — Emergency Department (HOSPITAL_COMMUNITY)
Admission: EM | Admit: 2019-11-16 | Discharge: 2019-11-16 | Disposition: A | Discharge status: Home / Self Care | Payer: Medicare Other | Attending: Emergency Medicine | Admitting: Emergency Medicine

## 2019-11-16 ENCOUNTER — Encounter (HOSPITAL_COMMUNITY): Payer: Self-pay

## 2019-11-16 DIAGNOSIS — Z87891 Personal history of nicotine dependence: Secondary | ICD-10-CM | POA: Diagnosis not present

## 2019-11-16 DIAGNOSIS — W57XXXA Bitten or stung by nonvenomous insect and other nonvenomous arthropods, initial encounter: Secondary | ICD-10-CM | POA: Insufficient documentation

## 2019-11-16 DIAGNOSIS — L03115 Cellulitis of right lower limb: Secondary | ICD-10-CM | POA: Diagnosis not present

## 2019-11-16 DIAGNOSIS — R21 Rash and other nonspecific skin eruption: Secondary | ICD-10-CM | POA: Diagnosis present

## 2019-11-16 MED ORDER — SULFAMETHOXAZOLE-TRIMETHOPRIM 800-160 MG PO TABS: 1.0000 | ORAL_TABLET | Freq: Two times a day (BID) | ORAL | 0 refills | Status: AC

## 2019-11-16 NOTE — ED Provider Notes (Signed)
[facility]-EMERGENCY DEPT Provider Note   CSN: [id number] Arrival date & time: 11/16/19  1229     History Chief Complaint  Patient presents with  . Insect Bite    Garrett Lewis is a 74 y.o. male.  HPI Patient is a 74 year old male with a medical history as noted below.  Patient states that he believes he was bit by a spider 2 days ago.  He has 2 small erythematous wounds noted to his right anterior thigh.  He reports moderate pain in the region.  No discharge or significant swelling.  No fevers, chills, abdominal pain, nausea, vomiting.     History reviewed. No pertinent past medical history.  Patient Active Problem List   Diagnosis Date Noted  . Chronic wound of head, neck, or trunk 04/29/2011  . Appendicitis with peritoneal abscess 07/29/2010    Past Surgical History:  Procedure Laterality Date  . APPENDECTOMY    . HAND SURGERY     left       Family History  Problem Relation Age of Onset  . Cancer Mother        breast    Social History   Tobacco Use  . Smoking status: Former Games developer  . Smokeless tobacco: Never Used  Vaping Use  . Vaping Use: Never used  Substance Use Topics  . Alcohol use: Yes    Comment: occasional  . Drug use: No    Home Medications Prior to Admission medications   Medication Sig Start Date End Date Taking? Authorizing Provider  cyclobenzaprine (FLEXERIL) 10 MG tablet Take 1 tablet (10 mg total) by mouth 2 (two) times daily as needed for muscle spasms. 03/08/15   Barrett, Rolm Gala, PA-C  fish oil-omega-3 fatty acids 1000 MG capsule Take 2 g by mouth daily.      [provider]  folic acid (FOLVITE) 1 MG tablet Take 1 mg by mouth daily.      [provider]  ibuprofen (ADVIL,MOTRIN) 600 MG tablet Take 1 tablet (600 mg total) every 6 (six) hours as needed by mouth. 11/30/16   Audry Pili, PA-C  sulfamethoxazole-trimethoprim (BACTRIM DS) 800-160 MG tablet Take 1 tablet by mouth 2 (two) times daily  for 7 days. 11/16/19 11/23/19  Placido Sou, PA-C  traMADol (ULTRAM) 50 MG tablet Take 1 tablet (50 mg total) every 6 (six) hours as needed by mouth. 11/30/16   Audry Pili, PA-C    Allergies    Patient has no known allergies.  Review of Systems   Review of Systems  Constitutional: Negative for chills and fever.  Respiratory: Negative for shortness of breath.   Cardiovascular: Negative for chest pain.  Gastrointestinal: Negative for nausea and vomiting.  Skin: Positive for color change and wound.  Neurological: Negative for weakness.    Physical Exam Updated Vital Signs BP 132/77 (BP Location: Left Arm)   Pulse 96   Temp 97.6 F (36.4 C) (Oral)   Resp 18   Ht 5\' 5"  (1.651 m)   Wt 72.6 kg   SpO2 96%   BMI 26.63 kg/m   Physical Exam Vitals and nursing note reviewed.  Constitutional:      General: He is not in acute distress.    Appearance: Normal appearance. He is not ill-appearing, toxic-appearing or diaphoretic.  HENT:     Head: Normocephalic and atraumatic.     Right Ear: External ear normal.     Left Ear: External ear normal.     Nose: Nose normal.  Mouth/Throat:     Mouth: Mucous membranes are moist.     Pharynx: Oropharynx is clear. No oropharyngeal exudate or posterior oropharyngeal erythema.  Eyes:     Extraocular Movements: Extraocular movements intact.  Cardiovascular:     Rate and Rhythm: Normal rate and regular rhythm.     Pulses: Normal pulses.     Heart sounds: Normal heart sounds. No murmur heard.  No friction rub. No gallop.   Pulmonary:     Effort: Pulmonary effort is normal. No respiratory distress.     Breath sounds: Normal breath sounds. No stridor. No wheezing, rhonchi or rales.  Abdominal:     General: Abdomen is flat.     Tenderness: There is no abdominal tenderness.  Musculoskeletal:        General: Normal range of motion.     Cervical back: Normal range of motion and neck supple. No tenderness.  Skin:    General: Skin is warm  and dry.     Findings: Erythema present.     Comments: 2 half centimeter erythematous nodules noted to the right anterior thigh with 2 to 3 cm of underlying induration and a small amount of surrounding cellulitis.  Moderate TTP overlying the site.  No purulent discharge noted with manipulation of the site.  No fluctuance.  Neurological:     General: No focal deficit present.     Mental Status: He is alert and oriented to person, place, and time.  Psychiatric:        Mood and Affect: Mood normal.        Behavior: Behavior normal.    ED Results / Procedures / Treatments   Labs (all labs ordered are listed, but only abnormal results are displayed) Labs Reviewed - No data to display  EKG None  Radiology No results found.  Procedures Procedures (including critical care time)  Medications Ordered in ED Medications - No data to display  ED Course  I have reviewed the triage vital signs and the nursing notes.  Pertinent labs & imaging results that were available during my care of the patient were reviewed by me and considered in my medical decision making (see chart for details).    MDM Rules/Calculators/A&P                          Patient is a 74 year old male who presents to the emergency department with what appears to be 2 insect bites to the right anterior thigh.  He has moderate tenderness on my exam and has a small amount of underlying induration but no palpable fluctuance or discharge.  Do not feel that an I&D is necessary at this time.  Patient has taken Bactrim in the past for similar symptoms.  Will discharge patient on a 7-day course of Bactrim.  He understands he needs to return to the ER if his symptoms continue to worsen.  His questions were answered and he was amicable at the time of discharge.  His vital signs are stable.  He is afebrile, not tachycardic, normotensive, not hypoxic.   An After Visit Summary was printed and given to the patient.  Patient  discharged to home/self care.  Condition at discharge: Stable  Note: Portions of this report may have been transcribed using voice recognition software. Every effort was made to ensure accuracy; however, inadvertent computerized transcription errors may be present.   Final Clinical Impression(s) / ED Diagnoses Final diagnoses:  Cellulitis of right lower extremity  Rx / DC Orders ED Discharge Orders         Ordered    sulfamethoxazole-trimethoprim (BACTRIM DS) 800-160 MG tablet  2 times daily        11/16/19 1306           Placido Sou, PA-C 11/16/19 1318    Pricilla Loveless, MD 11/17/19 [phone]

## 2019-11-16 NOTE — ED Triage Notes (Signed)
Patient reports that he got bit by a spider 2 days ago on his right thigh.

## 2019-11-16 NOTE — Discharge Instructions (Addendum)
I prescribed you a medication called Bactrim.  This is a strong antibiotic that you are going to take twice a day for the next 7 days.  Please do not stop taking this early.  Please return to the ER if you develop any new or worsening symptoms.  It was a pleasure to meet you.

## 2020-09-17 DIAGNOSIS — Z8719 Personal history of other diseases of the digestive system: Secondary | ICD-10-CM | POA: Diagnosis not present

## 2020-10-02 ENCOUNTER — Ambulatory Visit (HOSPITAL_COMMUNITY)
Admission: EM | Admit: 2020-10-02 | Discharge: 2020-10-02 | Disposition: A | Discharge status: Home / Self Care | Payer: Medicare Other

## 2020-10-02 ENCOUNTER — Ambulatory Visit (INDEPENDENT_AMBULATORY_CARE_PROVIDER_SITE_OTHER): Payer: Medicare Other

## 2020-10-02 ENCOUNTER — Encounter (HOSPITAL_COMMUNITY): Payer: Self-pay

## 2020-10-02 DIAGNOSIS — M79642 Pain in left hand: Secondary | ICD-10-CM

## 2020-10-02 DIAGNOSIS — W19XXXA Unspecified fall, initial encounter: Secondary | ICD-10-CM

## 2020-10-02 MED ORDER — IBUPROFEN 600 MG PO TABS: 600.0000 mg | ORAL_TABLET | Freq: Four times a day (QID) | ORAL | 0 refills | Status: AC | PRN

## 2020-10-02 NOTE — ED Provider Notes (Signed)
[facility]    CSN: [id number] Arrival date & time: 10/02/20  1939      History   Chief Complaint Chief Complaint  Patient presents with   Fall   Hand Pain    HPI Garrett Lewis is a 75 y.o. male.   HPI  Hand Pain: Patient reports that a few days ago he fell on his left hand.  He states that since this time he has had swelling and pain.  Swelling and pain is present throughout.  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  He reports no fever, loss of sensation or weakness.  He has not tried anything for symptoms.  No history of gout.  History reviewed. No pertinent past medical history.  Patient Active Problem List   Diagnosis Date Noted   Chronic wound of head, neck, or trunk 04/29/2011   Appendicitis with peritoneal abscess 07/29/2010    Past Surgical History:  Procedure Laterality Date   APPENDECTOMY     HAND SURGERY     left       Home Medications    Prior to Admission medications   Medication Sig Start Date End Date Taking? Authorizing Provider  Nutritional Supplements (VITAMIN D BOOSTER PO) Take by mouth.   Yes [provider]  cyclobenzaprine (FLEXERIL) 10 MG tablet Take 1 tablet (10 mg total) by mouth 2 (two) times daily as needed for muscle spasms. 03/08/15   Barrett, Rolm Gala, PA-C  fish oil-omega-3 fatty acids 1000 MG capsule Take 2 g by mouth daily.      [provider]  folic acid (FOLVITE) 1 MG tablet Take 1 mg by mouth daily.      [provider]  ibuprofen (ADVIL,MOTRIN) 600 MG tablet Take 1 tablet (600 mg total) every 6 (six) hours as needed by mouth. 11/30/16   Audry Pili, PA-C  traMADol (ULTRAM) 50 MG tablet Take 1 tablet (50 mg total) every 6 (six) hours as needed by mouth. 11/30/16   Audry Pili, PA-C    Family History Family History  Problem Relation Age of Onset   Cancer Mother        breast    Social History Social History   Tobacco Use    Smoking status: Former   Smokeless tobacco: Never  Building services engineer Use: Never used  Substance Use Topics   Alcohol use: Yes    Comment: occasional   Drug use: No     Allergies   Patient has no known allergies.   Review of Systems Review of Systems  As stated above in HPI Physical Exam Triage Vital Signs ED Triage Vitals  Enc Vitals Group     BP 10/02/20 2006 132/76     Pulse Rate 10/02/20 2006 96     Resp 10/02/20 2006 18     Temp 10/02/20 2006 98.6 F (37 C)     Temp Source 10/02/20 2006 Oral     SpO2 10/02/20 2006 98 %     Weight --      Height --      Head Circumference --      Peak Flow --      Pain Score 10/02/20 2002 7     Pain Loc --      Pain Edu? --      Excl. in GC? --    No data found.  Updated Vital Signs BP  132/76 (BP Location: Right Arm)   Pulse 96   Temp 98.6 F (37 C) (Oral)   Resp 18   SpO2 98%   Physical Exam Vitals and nursing note reviewed.  Constitutional:      General: He is not in acute distress.    Appearance: Normal appearance. He is not ill-appearing, toxic-appearing or diaphoretic.  Cardiovascular:     Rate and Rhythm: Normal rate and regular rhythm.     Pulses: Normal pulses.  Pulmonary:     Effort: Pulmonary effort is normal.     Breath sounds: Normal breath sounds.  Musculoskeletal:     Comments: The left hand is significantly swollen compared to the right.  The right hand is warm to touch.  Range of motion is present to about 70% of normal according to patient and is hindered due to swelling.  Skin:    General: Skin is warm.     Capillary Refill: Capillary refill takes less than 2 seconds.     Findings: No erythema or rash.     Comments: No evidence of skin breakdown or discharge.  Neurological:     Mental Status: He is alert and oriented to person, place, and time.     Sensory: No sensory deficit.     UC Treatments / Results  Labs (all labs ordered are listed, but only abnormal results are displayed) Labs  Reviewed - No data to display  EKG   Radiology DG Hand Complete Left  Result Date: 10/02/2020 CLINICAL DATA:  Swelling and pain for 3 days and hand. EXAM: LEFT HAND - COMPLETE 3+ VIEW COMPARISON:  None. FINDINGS: Surgical amputation versus congenital anomaly of the second digit metacarpal, third digit distal phalanx, fourth digit proximal phalanx, fifth digit distal phalanx. No cortical erosion or destruction. No evidence of fracture or dislocation. There is no evidence of arthropathy or other focal bone abnormality. Soft tissues are unremarkable. IMPRESSION: Negative for acute traumatic injury or abnormality. Electronically Signed   By: Tish Frederickson M.D.   On: 10/02/2020 20:32    Procedures Procedures (including critical care time)  Medications Ordered in UC Medications - No data to display  Initial Impression / Assessment and Plan / UC Course  I have reviewed the triage vital signs and the nursing notes.  Pertinent labs & imaging results that were available during my care of the patient were reviewed by me and considered in my medical decision making (see chart for details).     New.  No sign of fracture or osteomyelitis.  I am going to have him apply an Ace wrap, cold compress and I am going to give him NSAIDs to take PRN.  I would recommend that he closely monitor for progression of swelling or fevers that would indicate an infection.  He should also receive orthopedic follow-up given his history and the amount of swelling within the hand following his fall. Final Clinical Impressions(s) / UC Diagnoses   Final diagnoses:  None   Discharge Instructions   None    ED Prescriptions   None    PDMP not reviewed this encounter.   Rushie Chestnut, [place] 10/02/20 2036

## 2020-10-02 NOTE — ED Triage Notes (Signed)
Pt presents with pain and swelling in the left hand x 2 days. Pt reports he trip and fell over left hand 2 days ago.

## 2020-10-06 ENCOUNTER — Other Ambulatory Visit: Payer: Self-pay

## 2020-10-06 ENCOUNTER — Ambulatory Visit (HOSPITAL_COMMUNITY)
Admission: EM | Admit: 2020-10-06 | Discharge: 2020-10-06 | Disposition: A | Discharge status: Home / Self Care | Payer: Medicare Other | Attending: Student | Admitting: Student

## 2020-10-06 ENCOUNTER — Telehealth (HOSPITAL_COMMUNITY): Payer: Self-pay | Admitting: *Deleted

## 2020-10-06 ENCOUNTER — Encounter (HOSPITAL_COMMUNITY): Payer: Self-pay

## 2020-10-06 DIAGNOSIS — S60862A Insect bite (nonvenomous) of left wrist, initial encounter: Secondary | ICD-10-CM | POA: Diagnosis not present

## 2020-10-06 DIAGNOSIS — M25532 Pain in left wrist: Secondary | ICD-10-CM

## 2020-10-06 DIAGNOSIS — W57XXXA Bitten or stung by nonvenomous insect and other nonvenomous arthropods, initial encounter: Secondary | ICD-10-CM | POA: Diagnosis not present

## 2020-10-06 MED ORDER — METHYLPREDNISOLONE SODIUM SUCC 125 MG IJ SOLR
INTRAMUSCULAR | Status: AC
  Filled 2020-10-06: qty 2

## 2020-10-06 MED ORDER — DOXYCYCLINE HYCLATE 100 MG PO CAPS: 100.0000 mg | ORAL_CAPSULE | Freq: Two times a day (BID) | ORAL | 0 refills | Status: AC

## 2020-10-06 MED ORDER — DOXYCYCLINE HYCLATE 100 MG PO CAPS: 100.0000 mg | ORAL_CAPSULE | Freq: Two times a day (BID) | ORAL | 0 refills | Status: DC

## 2020-10-06 MED ORDER — METHYLPREDNISOLONE SODIUM SUCC 125 MG IJ SOLR
60.0000 mg | Freq: Once | INTRAMUSCULAR | Status: AC
  Administered 2020-10-06: 60 mg via INTRAMUSCULAR

## 2020-10-06 NOTE — Discharge Instructions (Addendum)
-  Doxycycline twice daily for 7 days.  Make sure to wear sunscreen while spending time outside while on this medication as it can increase your chance of sunburn. You can take this medication with food if you have a sensitive stomach.  

## 2020-10-06 NOTE — ED Provider Notes (Signed)
[facility]    CSN: [id number] Arrival date & time: 10/06/20  1747      History   Chief Complaint Chief Complaint  Patient presents with   Insect Bite   Arm Pain    HPI Garrett Lewis is a 75 y.o. male presenting with continued left wrist pain and swelling for 3 days following fall. Few insect bites present. He was last seen at our urgent care on 9/15, x-ray was performed that was normal at that time.  He was prescribed several medications, but he does not know what they are.  Denies new trauma or falls.  Denies fever/chills, discharge from insect bites.  HPI  History reviewed. No pertinent past medical history.  Patient Active Problem List   Diagnosis Date Noted   Chronic wound of head, neck, or trunk 04/29/2011   Appendicitis with peritoneal abscess 07/29/2010    Past Surgical History:  Procedure Laterality Date   APPENDECTOMY     HAND SURGERY     left       Home Medications    Prior to Admission medications   Medication Sig Start Date End Date Taking? Authorizing Provider  doxycycline (VIBRAMYCIN) 100 MG capsule Take 1 capsule (100 mg total) by mouth 2 (two) times daily for 7 days. 10/06/20 10/13/20 Yes Rhys Martini, PA-C  cyclobenzaprine (FLEXERIL) 10 MG tablet Take 1 tablet (10 mg total) by mouth 2 (two) times daily as needed for muscle spasms. 03/08/15   Barrett, Rolm Gala, PA-C  fish oil-omega-3 fatty acids 1000 MG capsule Take 2 g by mouth daily.      [provider]  folic acid (FOLVITE) 1 MG tablet Take 1 mg by mouth daily.      [provider]  ibuprofen (ADVIL) 600 MG tablet Take 1 tablet (600 mg total) by mouth every 6 (six) hours as needed. 10/02/20   Rushie Chestnut, PA-C  Nutritional Supplements (VITAMIN D BOOSTER PO) Take by mouth.    [provider]  traMADol (ULTRAM) 50 MG tablet Take 1 tablet (50 mg total) every 6 (six) hours as needed by mouth. 11/30/16   Audry Pili, PA-C    Family History Family History   Problem Relation Age of Onset   Cancer Mother        breast    Social History Social History   Tobacco Use   Smoking status: Former   Smokeless tobacco: Never  Building services engineer Use: Never used  Substance Use Topics   Alcohol use: Yes    Comment: occasional   Drug use: No     Allergies   Patient has no known allergies.   Review of Systems Review of Systems  Skin:        Insect bites  All other systems reviewed and are negative.   Physical Exam Triage Vital Signs ED Triage Vitals  Enc Vitals Group     BP 10/06/20 1806 125/74     Pulse Rate 10/06/20 1806 84     Resp 10/06/20 1806 18     Temp 10/06/20 1806 98.4 F (36.9 C)     Temp Source 10/06/20 1806 Oral     SpO2 10/06/20 1806 96 %     Weight --      Height --      Head Circumference --      Peak Flow --      Pain Score 10/06/20 1810 5     Pain Loc --  Pain Edu? --      Excl. in GC? --    No data found.  Updated Vital Signs BP 125/74 (BP Location: Right Arm)   Pulse 84   Temp 98.4 F (36.9 C) (Oral)   Resp 18   SpO2 96%   Visual Acuity Right Eye Distance:   Left Eye Distance:   Bilateral Distance:    Right Eye Near:   Left Eye Near:    Bilateral Near:     Physical Exam Vitals reviewed.  Constitutional:      General: He is not in acute distress.    Appearance: Normal appearance. He is not ill-appearing or diaphoretic.  HENT:     Head: Normocephalic and atraumatic.  Cardiovascular:     Rate and Rhythm: Normal rate and regular rhythm.     Heart sounds: Normal heart sounds.  Pulmonary:     Effort: Pulmonary effort is normal.     Breath sounds: Normal breath sounds.  Musculoskeletal:     Comments: L wrist mildly TTP without point tenderness. Mild effusion diffusely. No snuffbox tenderness. ROM intact. Cap refill <2 seconds, radial pulse 2+.   Skin:    General: Skin is warm.     Comments: L wrist with few papular lesions, flesh colored, no erythema warmth discharge.    Neurological:     General: No focal deficit present.     Mental Status: He is alert and oriented to person, place, and time.  Psychiatric:        Mood and Affect: Mood normal.        Behavior: Behavior normal.        Thought Content: Thought content normal.        Judgment: Judgment normal.     UC Treatments / Results  Labs (all labs ordered are listed, but only abnormal results are displayed) Labs Reviewed - No data to display  EKG   Radiology No results found.  Procedures Procedures (including critical care time)  Medications Ordered in UC Medications  methylPREDNISolone sodium succinate (SOLU-MEDROL) 125 mg/2 mL injection 60 mg (has no administration in time range)    Initial Impression / Assessment and Plan / UC Course  I have reviewed the triage vital signs and the nursing notes.  Pertinent labs & imaging results that were available during my care of the patient were reviewed by me and considered in my medical decision making (see chart for details).     This patient is a very pleasant 75 y.o. year old male presenting with L wrist pain and swelling x3 days following fall. Xray L hand/wrist 10/02/20 was negative for bony abnormality. Few insect bites overlying L wrist, they do not appear infected but patient expresses significant concern for this. Afebrile, nontachy.  IM solumedrol for inflammation and I am amenable to sending doxycyline to cover for infection. Wound care discussed.  ED return precautions discussed. Patient verbalizes understanding and agreement.   Final Clinical Impressions(s) / UC Diagnoses   Final diagnoses:  Left wrist pain  Insect bite of left wrist, initial encounter     Discharge Instructions      -Doxycycline twice daily for 7 days.  Make sure to wear sunscreen while spending time outside while on this medication as it can increase your chance of sunburn. You can take this medication with food if you have a sensitive  stomach.    ED Prescriptions     Medication Sig Dispense Auth. Provider   doxycycline (VIBRAMYCIN) 100 MG capsule  Take 1 capsule (100 mg total) by mouth 2 (two) times daily for 7 days. 14 capsule Rhys Martini, PA-C      PDMP not reviewed this encounter.   Rhys Martini, PA-C 10/06/20 1826

## 2020-10-06 NOTE — ED Triage Notes (Signed)
Pt presents with insect bite to left hand & right hand swelling X 3 days.

## 2021-03-07 ENCOUNTER — Other Ambulatory Visit: Payer: Self-pay | Admitting: Family

## 2021-03-07 ENCOUNTER — Ambulatory Visit
Admission: RE | Admit: 2021-03-07 | Discharge: 2021-03-07 | Disposition: A | Discharge status: Home / Self Care | Payer: Medicare Other | Source: Ambulatory Visit | Attending: Family | Admitting: Family

## 2021-03-07 DIAGNOSIS — W19XXXA Unspecified fall, initial encounter: Secondary | ICD-10-CM

## 2021-03-07 DIAGNOSIS — M25472 Effusion, left ankle: Secondary | ICD-10-CM

## 2023-08-09 ENCOUNTER — Emergency Department (HOSPITAL_BASED_OUTPATIENT_CLINIC_OR_DEPARTMENT_OTHER): Admission: EM | Admit: 2023-08-09 | Discharge: 2023-08-09 | Disposition: A | Discharge status: Home / Self Care

## 2023-08-09 ENCOUNTER — Emergency Department (HOSPITAL_BASED_OUTPATIENT_CLINIC_OR_DEPARTMENT_OTHER): Admitting: Radiology

## 2023-08-09 DIAGNOSIS — J449 Chronic obstructive pulmonary disease, unspecified: Secondary | ICD-10-CM | POA: Diagnosis not present

## 2023-08-09 DIAGNOSIS — R0602 Shortness of breath: Secondary | ICD-10-CM | POA: Insufficient documentation

## 2023-08-09 LAB — COMPREHENSIVE METABOLIC PANEL WITH GFR
ALT: 10 U/L (ref 0–44)
AST: 19 U/L (ref 15–41)
Albumin: 4.1 g/dL (ref 3.5–5.0)
Alkaline Phosphatase: 62 U/L (ref 38–126)
Anion gap: 10 (ref 5–15)
BUN: 14 mg/dL (ref 8–23)
CO2: 26 mmol/L (ref 22–32)
Calcium: 9.5 mg/dL (ref 8.9–10.3)
Chloride: 104 mmol/L (ref 98–111)
Creatinine, Ser: 1.15 mg/dL (ref 0.61–1.24)
GFR, Estimated: >60 mL/min (ref >60)
Glucose, Bld: 85 mg/dL (ref 70–99)
Potassium: 4.1 mmol/L (ref 3.5–5.1)
Sodium: 140 mmol/L (ref 135–145)
Total Bilirubin: 0.5 mg/dL (ref 0.0–1.2)
Total Protein: 7.1 g/dL (ref 6.5–8.1)

## 2023-08-09 LAB — CBC
HCT: 41.2 % (ref 39.0–52.0)
Hemoglobin: 13.2 g/dL (ref 13.0–17.0)
MCH: 26.6 pg (ref 26.0–34.0)
MCHC: 32 g/dL (ref 30.0–36.0)
MCV: 82.9 fL (ref 80.0–100.0)
Platelets: 264 K/uL (ref 150–400)
RBC: 4.97 MIL/uL (ref 4.22–5.81)
RDW: 15.2 % (ref 11.5–15.5)
WBC: 6.3 K/uL (ref 4.0–10.5)
nRBC: 0 % (ref 0.0–0.2)

## 2023-08-09 LAB — TROPONIN T, HIGH SENSITIVITY
Troponin T High Sensitivity: 21 ng/L — ABNORMAL HIGH (ref <19)
Troponin T High Sensitivity: 20 ng/L — ABNORMAL HIGH (ref <19)

## 2023-08-09 MED ORDER — ALBUTEROL SULFATE (2.5 MG/3ML) 0.083% IN NEBU
INHALATION_SOLUTION | RESPIRATORY_TRACT | Status: AC
  Filled 2023-08-09: qty 3

## 2023-08-09 MED ORDER — IPRATROPIUM-ALBUTEROL 0.5-2.5 (3) MG/3ML IN SOLN
3.0000 mL | Freq: Once | RESPIRATORY_TRACT | Status: AC
  Administered 2023-08-09: 3 mL via RESPIRATORY_TRACT

## 2023-08-09 MED ORDER — IPRATROPIUM-ALBUTEROL 0.5-2.5 (3) MG/3ML IN SOLN
RESPIRATORY_TRACT | Status: AC
  Filled 2023-08-09: qty 3

## 2023-08-09 MED ORDER — ALBUTEROL SULFATE (2.5 MG/3ML) 0.083% IN NEBU
2.5000 mg | INHALATION_SOLUTION | Freq: Once | RESPIRATORY_TRACT | Status: AC
  Administered 2023-08-09: 2.5 mg via RESPIRATORY_TRACT

## 2023-08-09 NOTE — ED Provider Notes (Signed)
 [facility] Provider Note   CSN: [id number] Arrival date & time: 08/09/23  9142     Patient presents with: Shortness of Breath   Garrett Lewis is a 78 y.o. 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  States once he gets out of the heat, he feels much better.  Denies any chest pain associated with this.  Denies any abdominal pain, nausea or vomiting, fever, chills, cough.  Denies any lower extremity swelling or pain.    Shortness of Breath      Prior to Admission medications   Medication Sig Start Date End Date Taking? Authorizing Provider  cyclobenzaprine  (FLEXERIL ) 10 MG tablet Take 1 tablet (10 mg total) by mouth 2 (two) times daily as needed for muscle spasms. 03/08/15   Barrett, Mimi, PA-C  fish oil-omega-3 fatty acids 1000 MG capsule Take 2 g by mouth daily.      [provider]  folic acid (FOLVITE) 1 MG tablet Take 1 mg by mouth daily.      [provider]  ibuprofen  (ADVIL ) 600 MG tablet Take 1 tablet (600 mg total) by mouth every 6 (six) hours as needed. 10/02/20   Tonette Lauraine HERO, PA-C  Nutritional Supplements (VITAMIN D BOOSTER PO) Take by mouth.    [provider]  traMADol  (ULTRAM ) 50 MG tablet Take 1 tablet (50 mg total) every 6 (six) hours as needed by mouth. 11/30/16   Olympia Gee, PA-C    Allergies: Patient has no known allergies.    Review of Systems  Respiratory:  Positive for shortness of breath.     Updated Vital Signs BP (!) 112/94   Pulse 68   Temp (!) 97 F (36.1 C) (Oral)   Resp 20   SpO2 100%   Physical Exam Vitals and nursing note reviewed.  Constitutional:      General: He is not in acute distress.    Appearance: He is well-developed.  HENT:     Head: Normocephalic and atraumatic.  Eyes:     Conjunctiva/sclera: Conjunctivae normal.  Cardiovascular:      Rate and Rhythm: Normal rate and regular rhythm.     Heart sounds: No murmur heard. Pulmonary:     Effort: Pulmonary effort is normal. No respiratory distress.     Breath sounds: Normal breath sounds.     Comments: Talking in full sentences on room air without difficulty No adventitious lung sounds bilaterally, patient evaluated after RT treated patient with DuoNeb Abdominal:     Palpations: Abdomen is soft.     Tenderness: There is no abdominal tenderness.  Musculoskeletal:        General: No swelling.     Cervical back: Neck supple.     Comments: No lower extremity edema  Skin:    General: Skin is warm and dry.     Capillary Refill: Capillary refill takes less than 2 seconds.  Neurological:     Mental Status: He is alert.  Psychiatric:        Mood and Affect: Mood normal.     (all labs ordered are listed, but only abnormal results are displayed) Labs Reviewed  TROPONIN T, HIGH SENSITIVITY - Abnormal; Notable for the following components:      Result Value   Troponin T High Sensitivity 21 (*)    All other components within normal limits  TROPONIN  T, HIGH SENSITIVITY - Abnormal; Notable for the following components:   Troponin T High Sensitivity 20 (*)    All other components within normal limits  CBC  COMPREHENSIVE METABOLIC PANEL WITH GFR    EKG: EKG Interpretation Date/Time:  Monday August 09 2023 09:05:35 EDT Ventricular Rate:  53 PR Interval:  144 QRS Duration:  81 QT Interval:  446 QTC Calculation: 419 R Axis:   93  Text Interpretation: Sinus rhythm Anteroseptal infarct, age indeterminate Confirmed by Neysa Clap [phone]) on 08/09/2023 10:37:44 AM  Radiology: [facility] Chest 2 View Result Date: 08/09/2023 CLINICAL DATA:  Increased shortness of breath for the past 2-3 days. Ex-smoker. EXAM: CHEST - 2 VIEW COMPARISON:  07/14/2010 FINDINGS: Normal-sized heart. Clear lungs with normal vascularity. The lungs are mildly hyperexpanded with mild peribronchial thickening. Mild  thoracic spine degenerative changes. IMPRESSION: Mild changes of COPD and chronic bronchitis.  No acute abnormality. Electronically Signed   By: Elspeth Bathe M.D.   On: 08/09/2023 10:12     Procedures   Medications Ordered in the ED  albuterol  (PROVENTIL ) (2.5 MG/3ML) 0.083% nebulizer solution 2.5 mg (2.5 mg Nebulization Given 08/09/23 0918)  ipratropium-albuterol  (DUONEB) 0.5-2.5 (3) MG/3ML nebulizer solution 3 mL (3 mLs Nebulization Given 08/09/23 0917)                HEART Score: 3                    Medical Decision Making Amount and/or Complexity of Data Reviewed Labs: ordered. Radiology: ordered.  Risk Prescription drug management.     Differential diagnosis includes but is not limited to COPD exacerbation, anemia, ACS, arrhythmia, aortic aneurysm, pericarditis, myocarditis, pericardial effusion, cardiac tamponade, musculoskeletal pain, GERD, Boerhaave's syndrome, DVT/PE, pneumonia, pleural effusion   ED Course:  Upon initial evaluation, patient is well-appearing, no acute distress.  Stable vitals.  He is not feeling short of breath currently.  No adventitious lung sounds bilaterally.  Talking in full sentences on room air without difficulty.  Maintaining on 100% oxygen  saturation on room air.  No chest pain.   Labs Ordered: I Ordered, and personally interpreted labs.  The pertinent results include:   CBC and CMP all within normal limits Initial troponin elevated at 21, repeat 20   Imaging Studies ordered: I ordered imaging studies including chest x-ray I independently visualized the imaging with scope of interpretation limited to determining acute life threatening conditions related to emergency care. Imaging showed  Mildly hyperexpanded lungs with mild peribronchial thickening, consistent with COPD and chronic bronchitis.  No acute abnormality I agree with the radiologist interpretation   Cardiac Monitoring: / EKG: The patient was maintained on a cardiac monitor.   I personally viewed and interpreted the cardiac monitored which showed an underlying rhythm of: Normal sinus rhythm  Upon re-evaluation, patient remains well-appearing with stable vitals.  Reports he still feels at his baseline without shortness of breath here or other symptoms currently.   Low concern for ACS at this time given troponin remains stable with initial troponin of 21 and repeat of 20, no chest pain, and EKG with normal sinus rhythm and no ST changes. Chest x-ray without any acute abnormality, he has changes consistent with baseline COPD.  Low concern for DVT at this time given no lower extremity pain or swelling, no shortness of breath or chest pain that is constant, no tachycardia, no recent long plane or car rides, or recent surgeries or hospitalizations, no history of blood clots. Given shortness  of breath only occurred when being out in the heat clearing bushes, feels fine when inside, suspect his feeling short of breath may have been secondary to the heat.  He also reports he is not taking medication prescribed for his COPD.  This may also be contributing if he is having COPD flare.  However, do not feel like he needs to be hospitalized or need any further workup at this time given well appearance, no current shortness of breath, stable vitals. Patient discussed with my attending Dr. Neysa who agrees with plan of care.    Impression: Shortness of breath  Disposition:  The patient was discharged home with instructions to follow-up with his PCP regarding his COPD medications.  Remain inside during the heat of the day and keep well-hydrated at home. Return precautions given.     This chart was dictated using voice recognition software, Dragon. Despite the best efforts of this provider to proofread and correct errors, errors may still occur which can change documentation meaning.        Final diagnoses:  Shortness of breath    ED Discharge Orders     None           Veta Palma, PA-C 08/09/23 1213    Neysa Caron PARAS, [place] 08/09/23 1537

## 2023-08-09 NOTE — Discharge Instructions (Addendum)
 Your workup today is reassuring. It is very unlikely that your shortness of breath is due to an issue in your heart.  Your cardiac enzyme (troponin) was slightly elevated but stable today. Your EKG which is a measure of the heart's electrical activity and rhythm is normal today. These would both show abnormalities if you were having a heart attack.  Your chest x-ray showed changes consistent with COPD.   Your blood counts, electrolytes, and kidney function were normal here today.  Please refrain from being out in the heat during the middle of the day.  Please keep well-hydrated at home.  Please follow-up with your PCP to discuss how to use your COPD medications if you have questions regarding this.  Return to the ER if you have any shortness of breath, difficulty breathing, worsening chest pain, dizziness, jaw pain, left arm or shoulder pain, abdominal pain, unexplained fever, any other new or concerning symptoms.

## 2023-08-09 NOTE — ED Triage Notes (Signed)
 Pt h/o COPD reports increased SOB for past 2-3 days.  Pt ambulated to room from triage and talkative with staff in room with no visible increased work of breathing  Pt concerned that he has been losing weight recently despite  normal appetite and no other symptoms.  Pt states he had been told his blood sugars were up 2 months ago and so has decreased his carb intake since.   Pt denies pain.
